# Patient Record
Sex: Female | Born: 1940
Health system: Southern US, Community
[De-identification: ages and names within clinical notes are randomized; demographics above are authoritative.]

## PROBLEM LIST (undated history)

## (undated) DIAGNOSIS — E1122 Type 2 diabetes mellitus with diabetic chronic kidney disease: Secondary | ICD-10-CM

## (undated) DIAGNOSIS — E785 Hyperlipidemia, unspecified: Secondary | ICD-10-CM

## (undated) DIAGNOSIS — Z794 Long term (current) use of insulin: Secondary | ICD-10-CM

## (undated) DIAGNOSIS — Z992 Dependence on renal dialysis: Secondary | ICD-10-CM

## (undated) DIAGNOSIS — N183 Chronic kidney disease, stage 3 unspecified: Secondary | ICD-10-CM

## (undated) DIAGNOSIS — I10 Essential (primary) hypertension: Secondary | ICD-10-CM

## (undated) DIAGNOSIS — N186 End stage renal disease: Secondary | ICD-10-CM

## (undated) DIAGNOSIS — I2510 Atherosclerotic heart disease of native coronary artery without angina pectoris: Secondary | ICD-10-CM

## (undated) HISTORY — PX: ABDOMINAL HYSTERECTOMY: SHX81

---

## 2016-08-14 HISTORY — PX: CORONARY ANGIOPLASTY WITH STENT PLACEMENT: SHX49

## 2016-08-14 HISTORY — PX: CARDIAC CATHETERIZATION: SHX172

## 2016-09-06 DIAGNOSIS — Z66 Do not resuscitate: Secondary | ICD-10-CM

## 2016-09-06 DIAGNOSIS — D649 Anemia, unspecified: Secondary | ICD-10-CM

## 2016-09-06 DIAGNOSIS — Z7982 Long term (current) use of aspirin: Secondary | ICD-10-CM

## 2016-09-06 DIAGNOSIS — I119 Hypertensive heart disease without heart failure: Secondary | ICD-10-CM

## 2016-09-06 DIAGNOSIS — E785 Hyperlipidemia, unspecified: Secondary | ICD-10-CM | POA: Diagnosis not present

## 2016-09-06 DIAGNOSIS — K219 Gastro-esophageal reflux disease without esophagitis: Secondary | ICD-10-CM | POA: Diagnosis not present

## 2016-09-06 DIAGNOSIS — F0390 Unspecified dementia without behavioral disturbance: Secondary | ICD-10-CM

## 2016-09-06 DIAGNOSIS — I25110 Atherosclerotic heart disease of native coronary artery with unstable angina pectoris: Secondary | ICD-10-CM

## 2016-09-06 DIAGNOSIS — I160 Hypertensive urgency: Secondary | ICD-10-CM

## 2016-09-06 DIAGNOSIS — J811 Chronic pulmonary edema: Secondary | ICD-10-CM

## 2016-09-06 DIAGNOSIS — R079 Chest pain, unspecified: Secondary | ICD-10-CM | POA: Diagnosis not present

## 2016-09-07 DIAGNOSIS — I25110 Atherosclerotic heart disease of native coronary artery with unstable angina pectoris: Secondary | ICD-10-CM | POA: Diagnosis not present

## 2016-09-07 DIAGNOSIS — J811 Chronic pulmonary edema: Secondary | ICD-10-CM | POA: Diagnosis not present

## 2016-09-07 DIAGNOSIS — I160 Hypertensive urgency: Secondary | ICD-10-CM | POA: Diagnosis not present

## 2016-09-07 DIAGNOSIS — R079 Chest pain, unspecified: Secondary | ICD-10-CM | POA: Diagnosis not present

## 2016-09-08 DIAGNOSIS — J811 Chronic pulmonary edema: Secondary | ICD-10-CM | POA: Diagnosis not present

## 2016-09-08 DIAGNOSIS — I25110 Atherosclerotic heart disease of native coronary artery with unstable angina pectoris: Secondary | ICD-10-CM | POA: Diagnosis not present

## 2016-09-08 DIAGNOSIS — I160 Hypertensive urgency: Secondary | ICD-10-CM | POA: Diagnosis not present

## 2016-09-08 DIAGNOSIS — R079 Chest pain, unspecified: Secondary | ICD-10-CM | POA: Diagnosis not present

## 2016-09-09 DIAGNOSIS — I160 Hypertensive urgency: Secondary | ICD-10-CM | POA: Diagnosis not present

## 2016-09-09 DIAGNOSIS — J811 Chronic pulmonary edema: Secondary | ICD-10-CM | POA: Diagnosis not present

## 2016-09-09 DIAGNOSIS — I25110 Atherosclerotic heart disease of native coronary artery with unstable angina pectoris: Secondary | ICD-10-CM | POA: Diagnosis not present

## 2016-09-09 DIAGNOSIS — R079 Chest pain, unspecified: Secondary | ICD-10-CM | POA: Diagnosis not present

## 2016-09-10 DIAGNOSIS — R079 Chest pain, unspecified: Secondary | ICD-10-CM | POA: Diagnosis not present

## 2016-09-10 DIAGNOSIS — J811 Chronic pulmonary edema: Secondary | ICD-10-CM | POA: Diagnosis not present

## 2016-09-10 DIAGNOSIS — I160 Hypertensive urgency: Secondary | ICD-10-CM | POA: Diagnosis not present

## 2016-09-10 DIAGNOSIS — I25110 Atherosclerotic heart disease of native coronary artery with unstable angina pectoris: Secondary | ICD-10-CM | POA: Diagnosis not present

## 2016-09-11 DIAGNOSIS — I25110 Atherosclerotic heart disease of native coronary artery with unstable angina pectoris: Secondary | ICD-10-CM | POA: Diagnosis not present

## 2016-09-11 DIAGNOSIS — J811 Chronic pulmonary edema: Secondary | ICD-10-CM | POA: Diagnosis not present

## 2016-09-11 DIAGNOSIS — R079 Chest pain, unspecified: Secondary | ICD-10-CM | POA: Diagnosis not present

## 2016-09-11 DIAGNOSIS — I160 Hypertensive urgency: Secondary | ICD-10-CM | POA: Diagnosis not present

## 2017-02-14 HISTORY — PX: CARDIAC CATHETERIZATION: SHX172

## 2017-02-18 DIAGNOSIS — K219 Gastro-esophageal reflux disease without esophagitis: Secondary | ICD-10-CM | POA: Diagnosis not present

## 2017-02-18 DIAGNOSIS — J189 Pneumonia, unspecified organism: Secondary | ICD-10-CM

## 2017-02-18 DIAGNOSIS — J9690 Respiratory failure, unspecified, unspecified whether with hypoxia or hypercapnia: Secondary | ICD-10-CM | POA: Diagnosis not present

## 2017-02-18 DIAGNOSIS — E119 Type 2 diabetes mellitus without complications: Secondary | ICD-10-CM | POA: Diagnosis not present

## 2017-02-18 DIAGNOSIS — I161 Hypertensive emergency: Secondary | ICD-10-CM

## 2017-02-18 DIAGNOSIS — R079 Chest pain, unspecified: Secondary | ICD-10-CM | POA: Diagnosis not present

## 2017-02-18 DIAGNOSIS — N189 Chronic kidney disease, unspecified: Secondary | ICD-10-CM

## 2017-02-18 DIAGNOSIS — E785 Hyperlipidemia, unspecified: Secondary | ICD-10-CM | POA: Diagnosis not present

## 2017-02-19 DIAGNOSIS — I249 Acute ischemic heart disease, unspecified: Secondary | ICD-10-CM | POA: Diagnosis not present

## 2017-02-19 DIAGNOSIS — R079 Chest pain, unspecified: Secondary | ICD-10-CM | POA: Diagnosis not present

## 2018-05-02 DIAGNOSIS — I119 Hypertensive heart disease without heart failure: Secondary | ICD-10-CM | POA: Diagnosis not present

## 2018-05-02 DIAGNOSIS — I249 Acute ischemic heart disease, unspecified: Secondary | ICD-10-CM | POA: Diagnosis not present

## 2018-05-02 DIAGNOSIS — Z7984 Long term (current) use of oral hypoglycemic drugs: Secondary | ICD-10-CM | POA: Diagnosis not present

## 2018-05-02 DIAGNOSIS — J181 Lobar pneumonia, unspecified organism: Secondary | ICD-10-CM | POA: Diagnosis not present

## 2018-05-02 DIAGNOSIS — Z7982 Long term (current) use of aspirin: Secondary | ICD-10-CM | POA: Diagnosis not present

## 2018-05-02 DIAGNOSIS — A419 Sepsis, unspecified organism: Secondary | ICD-10-CM | POA: Diagnosis not present

## 2018-05-02 DIAGNOSIS — I340 Nonrheumatic mitral (valve) insufficiency: Secondary | ICD-10-CM | POA: Diagnosis not present

## 2018-05-02 DIAGNOSIS — I509 Heart failure, unspecified: Secondary | ICD-10-CM | POA: Diagnosis present

## 2018-05-02 DIAGNOSIS — N289 Disorder of kidney and ureter, unspecified: Secondary | ICD-10-CM | POA: Diagnosis not present

## 2018-05-02 DIAGNOSIS — E785 Hyperlipidemia, unspecified: Secondary | ICD-10-CM | POA: Diagnosis not present

## 2018-05-02 DIAGNOSIS — I361 Nonrheumatic tricuspid (valve) insufficiency: Secondary | ICD-10-CM | POA: Diagnosis not present

## 2018-05-02 DIAGNOSIS — Z2821 Immunization not carried out because of patient refusal: Secondary | ICD-10-CM | POA: Diagnosis not present

## 2018-05-02 DIAGNOSIS — J690 Pneumonitis due to inhalation of food and vomit: Secondary | ICD-10-CM | POA: Diagnosis present

## 2018-05-02 DIAGNOSIS — E1165 Type 2 diabetes mellitus with hyperglycemia: Secondary | ICD-10-CM | POA: Diagnosis present

## 2018-05-02 DIAGNOSIS — I214 Non-ST elevation (NSTEMI) myocardial infarction: Secondary | ICD-10-CM | POA: Diagnosis not present

## 2018-05-02 DIAGNOSIS — N179 Acute kidney failure, unspecified: Secondary | ICD-10-CM | POA: Diagnosis present

## 2018-05-02 DIAGNOSIS — J9601 Acute respiratory failure with hypoxia: Secondary | ICD-10-CM | POA: Diagnosis present

## 2018-05-02 DIAGNOSIS — Z794 Long term (current) use of insulin: Secondary | ICD-10-CM | POA: Diagnosis not present

## 2018-05-02 DIAGNOSIS — E119 Type 2 diabetes mellitus without complications: Secondary | ICD-10-CM | POA: Diagnosis not present

## 2018-05-02 DIAGNOSIS — R0603 Acute respiratory distress: Secondary | ICD-10-CM | POA: Diagnosis not present

## 2018-05-02 DIAGNOSIS — J189 Pneumonia, unspecified organism: Secondary | ICD-10-CM | POA: Diagnosis not present

## 2018-05-02 DIAGNOSIS — N183 Chronic kidney disease, stage 3 (moderate): Secondary | ICD-10-CM | POA: Diagnosis present

## 2018-05-02 DIAGNOSIS — M1990 Unspecified osteoarthritis, unspecified site: Secondary | ICD-10-CM | POA: Diagnosis present

## 2018-05-02 DIAGNOSIS — J9691 Respiratory failure, unspecified with hypoxia: Secondary | ICD-10-CM | POA: Diagnosis not present

## 2018-05-02 DIAGNOSIS — Z87891 Personal history of nicotine dependence: Secondary | ICD-10-CM | POA: Diagnosis not present

## 2018-05-02 DIAGNOSIS — I130 Hypertensive heart and chronic kidney disease with heart failure and stage 1 through stage 4 chronic kidney disease, or unspecified chronic kidney disease: Secondary | ICD-10-CM | POA: Diagnosis not present

## 2018-05-02 DIAGNOSIS — Z8673 Personal history of transient ischemic attack (TIA), and cerebral infarction without residual deficits: Secondary | ICD-10-CM | POA: Diagnosis not present

## 2018-05-02 DIAGNOSIS — Z79899 Other long term (current) drug therapy: Secondary | ICD-10-CM | POA: Diagnosis not present

## 2018-05-02 DIAGNOSIS — I429 Cardiomyopathy, unspecified: Secondary | ICD-10-CM | POA: Diagnosis not present

## 2018-05-02 DIAGNOSIS — I2510 Atherosclerotic heart disease of native coronary artery without angina pectoris: Secondary | ICD-10-CM | POA: Diagnosis present

## 2018-05-02 DIAGNOSIS — E1110 Type 2 diabetes mellitus with ketoacidosis without coma: Secondary | ICD-10-CM | POA: Diagnosis not present

## 2018-05-02 DIAGNOSIS — J811 Chronic pulmonary edema: Secondary | ICD-10-CM | POA: Diagnosis not present

## 2018-05-03 DIAGNOSIS — I361 Nonrheumatic tricuspid (valve) insufficiency: Secondary | ICD-10-CM | POA: Diagnosis not present

## 2018-05-03 DIAGNOSIS — I340 Nonrheumatic mitral (valve) insufficiency: Secondary | ICD-10-CM | POA: Diagnosis not present

## 2018-05-03 DIAGNOSIS — N289 Disorder of kidney and ureter, unspecified: Secondary | ICD-10-CM | POA: Diagnosis not present

## 2018-05-03 DIAGNOSIS — I214 Non-ST elevation (NSTEMI) myocardial infarction: Secondary | ICD-10-CM

## 2018-05-03 DIAGNOSIS — E1110 Type 2 diabetes mellitus with ketoacidosis without coma: Secondary | ICD-10-CM | POA: Diagnosis not present

## 2018-05-03 DIAGNOSIS — J9601 Acute respiratory failure with hypoxia: Secondary | ICD-10-CM

## 2018-05-03 DIAGNOSIS — J9691 Respiratory failure, unspecified with hypoxia: Secondary | ICD-10-CM | POA: Diagnosis not present

## 2018-05-04 DIAGNOSIS — E1110 Type 2 diabetes mellitus with ketoacidosis without coma: Secondary | ICD-10-CM | POA: Diagnosis not present

## 2018-05-04 DIAGNOSIS — J9691 Respiratory failure, unspecified with hypoxia: Secondary | ICD-10-CM | POA: Diagnosis not present

## 2018-05-04 DIAGNOSIS — I214 Non-ST elevation (NSTEMI) myocardial infarction: Secondary | ICD-10-CM | POA: Diagnosis not present

## 2018-05-05 ENCOUNTER — Inpatient Hospital Stay: Payer: Medicare Other

## 2018-05-05 ENCOUNTER — Encounter (HOSPITAL_COMMUNITY): Payer: Self-pay | Admitting: Cardiology

## 2018-05-05 ENCOUNTER — Inpatient Hospital Stay (HOSPITAL_COMMUNITY)
Admission: AD | Admit: 2018-05-05 | Discharge: 2018-05-09 | DRG: 246 | Disposition: A | Discharge status: Skilled Nursing Facility | Payer: Medicare Other | Source: Other Acute Inpatient Hospital | Attending: Internal Medicine | Admitting: Internal Medicine

## 2018-05-05 DIAGNOSIS — I429 Cardiomyopathy, unspecified: Secondary | ICD-10-CM | POA: Diagnosis present

## 2018-05-05 DIAGNOSIS — Z79899 Other long term (current) drug therapy: Secondary | ICD-10-CM

## 2018-05-05 DIAGNOSIS — Z955 Presence of coronary angioplasty implant and graft: Secondary | ICD-10-CM | POA: Diagnosis not present

## 2018-05-05 DIAGNOSIS — I480 Paroxysmal atrial fibrillation: Secondary | ICD-10-CM | POA: Diagnosis not present

## 2018-05-05 DIAGNOSIS — M6281 Muscle weakness (generalized): Secondary | ICD-10-CM | POA: Diagnosis not present

## 2018-05-05 DIAGNOSIS — Z800 Family history of malignant neoplasm of digestive organs: Secondary | ICD-10-CM | POA: Diagnosis not present

## 2018-05-05 DIAGNOSIS — E1122 Type 2 diabetes mellitus with diabetic chronic kidney disease: Secondary | ICD-10-CM | POA: Diagnosis not present

## 2018-05-05 DIAGNOSIS — Z7982 Long term (current) use of aspirin: Secondary | ICD-10-CM

## 2018-05-05 DIAGNOSIS — I2510 Atherosclerotic heart disease of native coronary artery without angina pectoris: Secondary | ICD-10-CM | POA: Diagnosis present

## 2018-05-05 DIAGNOSIS — E1110 Type 2 diabetes mellitus with ketoacidosis without coma: Secondary | ICD-10-CM | POA: Diagnosis present

## 2018-05-05 DIAGNOSIS — R9389 Abnormal findings on diagnostic imaging of other specified body structures: Secondary | ICD-10-CM | POA: Diagnosis not present

## 2018-05-05 DIAGNOSIS — Z741 Need for assistance with personal care: Secondary | ICD-10-CM | POA: Diagnosis not present

## 2018-05-05 DIAGNOSIS — N289 Disorder of kidney and ureter, unspecified: Secondary | ICD-10-CM | POA: Diagnosis not present

## 2018-05-05 DIAGNOSIS — Z825 Family history of asthma and other chronic lower respiratory diseases: Secondary | ICD-10-CM | POA: Diagnosis not present

## 2018-05-05 DIAGNOSIS — J9601 Acute respiratory failure with hypoxia: Secondary | ICD-10-CM | POA: Diagnosis not present

## 2018-05-05 DIAGNOSIS — D631 Anemia in chronic kidney disease: Secondary | ICD-10-CM | POA: Diagnosis present

## 2018-05-05 DIAGNOSIS — I252 Old myocardial infarction: Secondary | ICD-10-CM | POA: Diagnosis not present

## 2018-05-05 DIAGNOSIS — I132 Hypertensive heart and chronic kidney disease with heart failure and with stage 5 chronic kidney disease, or end stage renal disease: Secondary | ICD-10-CM | POA: Diagnosis not present

## 2018-05-05 DIAGNOSIS — K219 Gastro-esophageal reflux disease without esophagitis: Secondary | ICD-10-CM | POA: Diagnosis not present

## 2018-05-05 DIAGNOSIS — Z79891 Long term (current) use of opiate analgesic: Secondary | ICD-10-CM | POA: Diagnosis not present

## 2018-05-05 DIAGNOSIS — Z794 Long term (current) use of insulin: Secondary | ICD-10-CM

## 2018-05-05 DIAGNOSIS — I340 Nonrheumatic mitral (valve) insufficiency: Secondary | ICD-10-CM | POA: Diagnosis present

## 2018-05-05 DIAGNOSIS — R279 Unspecified lack of coordination: Secondary | ICD-10-CM | POA: Diagnosis not present

## 2018-05-05 DIAGNOSIS — I5043 Acute on chronic combined systolic (congestive) and diastolic (congestive) heart failure: Secondary | ICD-10-CM | POA: Diagnosis not present

## 2018-05-05 DIAGNOSIS — Z743 Need for continuous supervision: Secondary | ICD-10-CM | POA: Diagnosis not present

## 2018-05-05 DIAGNOSIS — E785 Hyperlipidemia, unspecified: Secondary | ICD-10-CM | POA: Diagnosis present

## 2018-05-05 DIAGNOSIS — Z8249 Family history of ischemic heart disease and other diseases of the circulatory system: Secondary | ICD-10-CM | POA: Diagnosis not present

## 2018-05-05 DIAGNOSIS — Z992 Dependence on renal dialysis: Secondary | ICD-10-CM

## 2018-05-05 DIAGNOSIS — R0603 Acute respiratory distress: Secondary | ICD-10-CM | POA: Diagnosis not present

## 2018-05-05 DIAGNOSIS — N179 Acute kidney failure, unspecified: Secondary | ICD-10-CM | POA: Diagnosis not present

## 2018-05-05 DIAGNOSIS — I5023 Acute on chronic systolic (congestive) heart failure: Secondary | ICD-10-CM | POA: Diagnosis present

## 2018-05-05 DIAGNOSIS — N183 Chronic kidney disease, stage 3 unspecified: Secondary | ICD-10-CM | POA: Diagnosis present

## 2018-05-05 DIAGNOSIS — Z6834 Body mass index (BMI) 34.0-34.9, adult: Secondary | ICD-10-CM

## 2018-05-05 DIAGNOSIS — R269 Unspecified abnormalities of gait and mobility: Secondary | ICD-10-CM | POA: Diagnosis not present

## 2018-05-05 DIAGNOSIS — I25119 Atherosclerotic heart disease of native coronary artery with unspecified angina pectoris: Secondary | ICD-10-CM | POA: Diagnosis not present

## 2018-05-05 DIAGNOSIS — R633 Feeding difficulties: Secondary | ICD-10-CM | POA: Diagnosis not present

## 2018-05-05 DIAGNOSIS — I214 Non-ST elevation (NSTEMI) myocardial infarction: Secondary | ICD-10-CM | POA: Diagnosis not present

## 2018-05-05 DIAGNOSIS — R278 Other lack of coordination: Secondary | ICD-10-CM | POA: Diagnosis not present

## 2018-05-05 DIAGNOSIS — R0602 Shortness of breath: Secondary | ICD-10-CM | POA: Diagnosis not present

## 2018-05-05 DIAGNOSIS — N186 End stage renal disease: Secondary | ICD-10-CM | POA: Diagnosis not present

## 2018-05-05 DIAGNOSIS — J189 Pneumonia, unspecified organism: Secondary | ICD-10-CM | POA: Diagnosis present

## 2018-05-05 DIAGNOSIS — R262 Difficulty in walking, not elsewhere classified: Secondary | ICD-10-CM | POA: Diagnosis not present

## 2018-05-05 DIAGNOSIS — I5021 Acute systolic (congestive) heart failure: Secondary | ICD-10-CM | POA: Diagnosis not present

## 2018-05-05 DIAGNOSIS — Z7722 Contact with and (suspected) exposure to environmental tobacco smoke (acute) (chronic): Secondary | ICD-10-CM | POA: Diagnosis present

## 2018-05-05 DIAGNOSIS — I071 Rheumatic tricuspid insufficiency: Secondary | ICD-10-CM | POA: Diagnosis present

## 2018-05-05 DIAGNOSIS — Z8673 Personal history of transient ischemic attack (TIA), and cerebral infarction without residual deficits: Secondary | ICD-10-CM

## 2018-05-05 DIAGNOSIS — I10 Essential (primary) hypertension: Secondary | ICD-10-CM | POA: Diagnosis present

## 2018-05-05 DIAGNOSIS — J9691 Respiratory failure, unspecified with hypoxia: Secondary | ICD-10-CM | POA: Diagnosis not present

## 2018-05-05 HISTORY — DX: Type 2 diabetes mellitus with diabetic chronic kidney disease: E11.22

## 2018-05-05 HISTORY — DX: Chronic kidney disease, stage 3 unspecified: N18.30

## 2018-05-05 HISTORY — DX: End stage renal disease: N18.6

## 2018-05-05 HISTORY — DX: Dependence on renal dialysis: Z99.2

## 2018-05-05 HISTORY — DX: Essential (primary) hypertension: I10

## 2018-05-05 HISTORY — DX: Atherosclerotic heart disease of native coronary artery without angina pectoris: I25.10

## 2018-05-05 HISTORY — DX: Type 2 diabetes mellitus with diabetic chronic kidney disease: Z79.4

## 2018-05-05 HISTORY — DX: Hyperlipidemia, unspecified: E78.5

## 2018-05-05 HISTORY — DX: Chronic kidney disease, stage 3 (moderate): N18.3

## 2018-05-05 LAB — CBC WITH DIFFERENTIAL/PLATELET
Abs Immature Granulocytes: 0.06 K/uL (ref 0.00–0.07)
Basophils Absolute: 0 K/uL (ref 0.0–0.1)
Basophils Relative: 0 %
Eosinophils Absolute: 0 K/uL (ref 0.0–0.5)
Eosinophils Relative: 0 %
HCT: 32.9 % — ABNORMAL LOW (ref 36.0–46.0)
Hemoglobin: 10.1 g/dL — ABNORMAL LOW (ref 12.0–15.0)
Immature Granulocytes: 1 %
Lymphocytes Relative: 13 %
Lymphs Abs: 1.1 K/uL (ref 0.7–4.0)
MCH: 28.9 pg (ref 26.0–34.0)
MCHC: 30.7 g/dL (ref 30.0–36.0)
MCV: 94.3 fL (ref 80.0–100.0)
Monocytes Absolute: 0.6 K/uL (ref 0.1–1.0)
Monocytes Relative: 8 %
Neutro Abs: 6.3 K/uL (ref 1.7–7.7)
Neutrophils Relative %: 78 %
Platelets: 212 K/uL (ref 150–400)
RBC: 3.49 MIL/uL — ABNORMAL LOW (ref 3.87–5.11)
RDW: 15.5 % (ref 11.5–15.5)
WBC: 8.1 K/uL (ref 4.0–10.5)
nRBC: 0 % (ref 0.0–0.2)

## 2018-05-05 LAB — COMPREHENSIVE METABOLIC PANEL
ALT: 91 U/L — ABNORMAL HIGH (ref 0–44)
AST: 67 U/L — ABNORMAL HIGH (ref 15–41)
Albumin: 2.5 g/dL — ABNORMAL LOW (ref 3.5–5.0)
Alkaline Phosphatase: 72 U/L (ref 38–126)
Anion gap: 13 (ref 5–15)
BUN: 46 mg/dL — AB (ref 8–23)
CO2: 15 mmol/L — ABNORMAL LOW (ref 22–32)
Calcium: 8.2 mg/dL — ABNORMAL LOW (ref 8.9–10.3)
Chloride: 113 mmol/L — ABNORMAL HIGH (ref 98–111)
Creatinine, Ser: 1.91 mg/dL — ABNORMAL HIGH (ref 0.44–1.00)
GFR calc Af Amer: 29 mL/min — ABNORMAL LOW (ref >60)
GFR calc non Af Amer: 25 mL/min — ABNORMAL LOW (ref >60)
Glucose, Bld: 276 mg/dL — ABNORMAL HIGH (ref 70–99)
Potassium: 4.4 mmol/L (ref 3.5–5.1)
Sodium: 141 mmol/L (ref 135–145)
Total Bilirubin: 0.7 mg/dL (ref 0.3–1.2)
Total Protein: 5.2 g/dL — ABNORMAL LOW (ref 6.5–8.1)

## 2018-05-05 LAB — HEPARIN LEVEL (UNFRACTIONATED): Heparin Unfractionated: 0.12 IU/mL — ABNORMAL LOW (ref 0.30–0.70)

## 2018-05-05 LAB — GLUCOSE, CAPILLARY
Glucose-Capillary: 178 mg/dL — ABNORMAL HIGH (ref 70–99)
Glucose-Capillary: 203 mg/dL — ABNORMAL HIGH (ref 70–99)
Glucose-Capillary: 228 mg/dL — ABNORMAL HIGH (ref 70–99)

## 2018-05-05 LAB — TROPONIN I: Troponin I: 12.77 ng/mL (ref <0.03)

## 2018-05-05 MED ORDER — GLIMEPIRIDE 4 MG PO TABS
2.0000 mg | ORAL_TABLET | Freq: Every day | ORAL | Status: DC
  Administered 2018-05-06: 2 mg via ORAL
  Filled 2018-05-05: qty 2

## 2018-05-05 MED ORDER — PRAVASTATIN SODIUM 40 MG PO TABS
40.0000 mg | ORAL_TABLET | Freq: Every day | ORAL | Status: DC
  Administered 2018-05-05 – 2018-05-07 (×3): 40 mg via ORAL
  Filled 2018-05-05 (×3): qty 1

## 2018-05-05 MED ORDER — INSULIN GLARGINE 100 UNIT/ML ~~LOC~~ SOLN: 3.0000 [IU] | Freq: Every day | SUBCUTANEOUS | Status: DC

## 2018-05-05 MED ORDER — HEPARIN (PORCINE) 25000 UT/250ML-% IV SOLN
1100.0000 [IU]/h | INTRAVENOUS | Status: DC
  Administered 2018-05-05: 900 [IU]/h via INTRAVENOUS
  Filled 2018-05-05: qty 250

## 2018-05-05 MED ORDER — AMLODIPINE BESYLATE 10 MG PO TABS
10.0000 mg | ORAL_TABLET | Freq: Every day | ORAL | Status: DC
  Administered 2018-05-06 – 2018-05-07 (×2): 10 mg via ORAL
  Filled 2018-05-05 (×2): qty 1

## 2018-05-05 MED ORDER — PIOGLITAZONE HCL 15 MG PO TABS
15.0000 mg | ORAL_TABLET | Freq: Every day | ORAL | Status: DC
  Administered 2018-05-06: 15 mg via ORAL
  Filled 2018-05-05: qty 1

## 2018-05-05 MED ORDER — SODIUM CHLORIDE 0.9 % IV SOLN
INTRAVENOUS | Status: DC
  Administered 2018-05-05: 21:00:00 via INTRAVENOUS

## 2018-05-05 MED ORDER — LISINOPRIL 40 MG PO TABS
40.0000 mg | ORAL_TABLET | Freq: Every day | ORAL | Status: DC
  Administered 2018-05-06: 40 mg via ORAL
  Filled 2018-05-05: qty 1

## 2018-05-05 MED ORDER — SODIUM CHLORIDE 0.9% FLUSH
10.0000 mL | Freq: Two times a day (BID) | INTRAVENOUS | Status: DC
  Administered 2018-05-05 – 2018-05-09 (×5): 10 mL

## 2018-05-05 MED ORDER — FUROSEMIDE 20 MG PO TABS
20.0000 mg | ORAL_TABLET | Freq: Every day | ORAL | Status: DC
  Administered 2018-05-05 – 2018-05-07 (×3): 20 mg via ORAL
  Filled 2018-05-05 (×3): qty 1

## 2018-05-05 MED ORDER — SODIUM CHLORIDE 0.9% FLUSH: 3.0000 mL | INTRAVENOUS | Status: DC | PRN

## 2018-05-05 MED ORDER — ONDANSETRON HCL 4 MG PO TABS: 4.0000 mg | ORAL_TABLET | Freq: Four times a day (QID) | ORAL | Status: DC | PRN

## 2018-05-05 MED ORDER — ONDANSETRON HCL 4 MG/2ML IJ SOLN
4.0000 mg | Freq: Four times a day (QID) | INTRAMUSCULAR | Status: DC | PRN
  Administered 2018-05-07 – 2018-05-08 (×2): 4 mg via INTRAVENOUS
  Filled 2018-05-05 (×2): qty 2

## 2018-05-05 MED ORDER — ACETAMINOPHEN 325 MG PO TABS: 650.0000 mg | ORAL_TABLET | Freq: Four times a day (QID) | ORAL | Status: DC | PRN

## 2018-05-05 MED ORDER — SODIUM CHLORIDE 0.9% FLUSH
10.0000 mL | INTRAVENOUS | Status: DC | PRN
  Administered 2018-05-07: 05:00:00 10 mL
  Administered 2018-05-08: 30 mL
  Administered 2018-05-09: 10 mL
  Filled 2018-05-05 (×3): qty 40

## 2018-05-05 MED ORDER — INSULIN ASPART 100 UNIT/ML ~~LOC~~ SOLN
0.0000 [IU] | Freq: Three times a day (TID) | SUBCUTANEOUS | Status: DC
  Administered 2018-05-06: 5 [IU] via SUBCUTANEOUS
  Administered 2018-05-06: 2 [IU] via SUBCUTANEOUS
  Administered 2018-05-06: 3 [IU] via SUBCUTANEOUS
  Administered 2018-05-07: 07:00:00 2 [IU] via SUBCUTANEOUS
  Administered 2018-05-07 – 2018-05-08 (×5): 3 [IU] via SUBCUTANEOUS
  Administered 2018-05-09: 12:00:00 2 [IU] via SUBCUTANEOUS
  Administered 2018-05-09: 17:00:00 3 [IU] via SUBCUTANEOUS

## 2018-05-05 MED ORDER — NITROGLYCERIN 0.4 MG SL SUBL: 0.4000 mg | SUBLINGUAL_TABLET | SUBLINGUAL | Status: DC | PRN

## 2018-05-05 MED ORDER — SODIUM CHLORIDE 0.9% FLUSH
3.0000 mL | Freq: Two times a day (BID) | INTRAVENOUS | Status: DC
  Administered 2018-05-05: 3 mL via INTRAVENOUS

## 2018-05-05 MED ORDER — ACETAMINOPHEN 650 MG RE SUPP: 650.0000 mg | Freq: Four times a day (QID) | RECTAL | Status: DC | PRN

## 2018-05-05 MED ORDER — TRAMADOL HCL 50 MG PO TABS: 50.0000 mg | ORAL_TABLET | Freq: Four times a day (QID) | ORAL | Status: DC | PRN

## 2018-05-05 MED ORDER — HEPARIN BOLUS VIA INFUSION
2000.0000 [IU] | Freq: Once | INTRAVENOUS | Status: AC
  Administered 2018-05-05: 2000 [IU] via INTRAVENOUS
  Filled 2018-05-05: qty 2000

## 2018-05-05 MED ORDER — SODIUM CHLORIDE 0.9 % IV SOLN: 250.0000 mL | INTRAVENOUS | Status: DC | PRN

## 2018-05-05 MED ORDER — METFORMIN HCL 500 MG PO TABS
500.0000 mg | ORAL_TABLET | Freq: Two times a day (BID) | ORAL | Status: DC
  Administered 2018-05-05: 500 mg via ORAL
  Filled 2018-05-05: qty 1

## 2018-05-05 MED ORDER — ASPIRIN 81 MG PO CHEW
81.0000 mg | CHEWABLE_TABLET | ORAL | Status: AC
  Administered 2018-05-06: 81 mg via ORAL
  Filled 2018-05-05: qty 1

## 2018-05-05 MED ORDER — METOPROLOL TARTRATE 25 MG PO TABS
50.0000 mg | ORAL_TABLET | Freq: Two times a day (BID) | ORAL | Status: DC
  Administered 2018-05-05 – 2018-05-07 (×5): 50 mg via ORAL
  Filled 2018-05-05 (×3): qty 2
  Filled 2018-05-05 (×2): qty 1

## 2018-05-05 NOTE — Progress Notes (Addendum)
[place] for heparin  Indication: chest pain/ACS  Allergies  Allergen Reactions  . Codeine Other (See Comments)  . Penicillins Other (See Comments)    Patient Measurements: Heparin dosing weight: ~ 72kg Wt= 88kg (1/18 at [facility]) Ht: 5' 3''  Vital Signs: Temp: 98.8 F (37.1 C) (01/20 2035) Temp Source: Oral (01/20 2035) BP: 118/77 (01/20 2035) Pulse Rate: 103 (01/20 2037)  Labs: Recent Labs    05/05/18 2018 05/05/18 2234  HGB 10.1*  --   HCT 32.9*  --   PLT 212  --   HEPARINUNFRC  --  0.12*  CREATININE 1.91*  --   TROPONINI 12.77*  --     CrCl cannot be calculated (Unknown ideal weight.).  Assessment: 78 yo female with NSTEMI for heparin  Goal of Therapy:  Heparin level 0.3-0.7 units/ml Monitor platelets by anticoagulation protocol: Yes   Plan:  Heparin 2000 units IV bolus, then increase heparin 1100 units/hr Follow-up am labs.   Phillis Knack, PharmD, BCPS

## 2018-05-05 NOTE — Consult Note (Addendum)
Cardiology Consultation:   Patient ID: Ashley Fuller MRN: [id number]; DOB: December 14, 1940  Admit date: 05/05/2018 Date of Consult: 05/05/2018  Primary Care Provider: Raina Mina., MD Primary Cardiologist: No primary care provider on file.  Ashley Fuller, [facility] in [place] Primary Electrophysiologist:  None    Patient Profile:   Ashley Fuller is a 78 y.o. female with a hx of CAD s/p NSTEMI with DES to LAD and D1 08/2016, PAF in setting of pneumonia, hyperlipidemia, CVA, type 2 diabetes, CKD stage III who is being seen today for the evaluation of NSTEMI at the request of Ashley Fuller.  History of Present Illness:   Ashley Fuller has a history of non-ST elevation MI in May 2018 and under went stent placement to the proximal LAD and first diagonal.  In November 2018 she was feeling somewhat ill per record review and was transferred to [facility] where she underwent a repeat cardiac catheterization that demonstrated patent stent sites with some mid LAD disease which was managed medically.  She apparently had pneumonia at that time and was treated for that.  There was concerned by home physical therapy that she was getting bradycardic so a Holter monitor was checked and demonstrated no significant bradycardia.  She is noted to be very sedentary, sitting watching TV most of the time.  The presented to the emergency department at [facility] due to complaints of worsening cough and chest soreness due to cough that had been ongoing for almost 2 weeks and with associated shortness of breath.  Cough was productive of white sputum and worsened at night.  Patient presented with a worse coughing fit at dinner with associated chest soreness.  It was noted that at baseline the patient ambulates with a walker and lives at home with her son and family.  Initially her blood pressure was elevated at 180/115 and she was tachycardic and tachypneic.  She was also hypoxic per ABG.  She had  leukocytosis and elevated BUN/creatinine 38/1.6 with baseline creatinine 1.1-1.2.  She was hyperglycemic with anion gap of 21 and elevated lactic acid.  EKG showed normal sinus rhythm at 97 bpm with T wave inversions in leads I, aVL and V1.  She was started on breathing treatments, IV hydration and IV antibiotics.  Flu swab was negative.  She was also started on an IV insulin drip and placed on BiPAP. Chest x-ray on 05/04/2018 showed patchy perihilar lung opacities, slightly improved, which may represent improving pulmonary edema, although a component of pneumonia is not excluded.  Stable small right pleural effusion.  Stable mild cardiomegaly.  She was admitted for treatment of acute hypoxic respiratory failure and DKA.  She was found to have an elevated troponinI 3.11> 18.10  > 42.60> 35.30> 20.60> 25.60. Echocardiogram was technically difficult, showed normal LV wall thickness, moderately-severely impaired LV function with EF 30-35%, left atrium severely dilated, moderate mitral regurgitation and mild tricuspid regurgitation.  She was seen in Cardiology consultation by Dr. Geraldo Fuller and the patient was transferred from [facility] to [facility] for further management and treatment of NSTEMI.   Upon my examination the patient denies any chest pain or discomfort at any point during this illness.  She does have chronic shortness of breath, worsened at presentation, seems to be somewhat better now.  The patient is not very conversant but denies any overall discomfort.  Her daughter is very knowledgeable and does most of the talking.  The daughter reports that the patient has been  on Brilinta and aspirin however at this next refill of Brilinta the cost was to be greater than $500 so she called Ashley Fuller who told her that the patient could stop Brilinta and switch to aspirin 325 mg daily.  Just prior to admission she was still taking the Brilinta as she had not yet run out.  The patient has never  smoked although her husband smoked and she was exposed to secondhand smoke.  She does not drink alcohol.  She uses a walker at home and is mostly sedentary.  Dr. Geraldo Fuller put the patient on Plavix while at [facility].  This would be a more affordable choice for the patient.  Past Medical History:  Diagnosis Date  . CAD (coronary artery disease)    a. NSTEMI with DES to prox LAD & 1st diag 08/2016  . CKD stage 3 due to type 2 diabetes mellitus ([place])   . Controlled type 2 diabetes mellitus with chronic kidney disease on chronic dialysis, with long-term current use of insulin ([place])   . Hyperlipidemia LDL goal <70   . Hypertension     Past Surgical History:  Procedure Laterality Date  . CARDIAC CATHETERIZATION  02/2017  . CARDIAC CATHETERIZATION  08/2016  . CORONARY ANGIOPLASTY WITH STENT PLACEMENT  08/2016     Home Medications:  Prior to Admission medications   Medication Sig Start Date End Date Taking? Authorizing Provider  amLODipine (NORVASC) 10 MG tablet Take 10 mg by mouth daily.   Yes [provider]  aspirin EC 81 MG tablet Take 81 mg by mouth daily.   Yes [provider]  clotrimazole (LOTRIMIN) 1 % cream Apply 1 application topically 2 (two) times daily as needed (yeast infection under breast).   Yes [provider]  furosemide (LASIX) 20 MG tablet Take 20 mg by mouth daily.   Yes [provider]  glimepiride (AMARYL) 2 MG tablet Take 2 mg by mouth daily with breakfast.   Yes [provider]  insulin glargine (LANTUS) 100 UNIT/ML injection Inject 3-15 Units into the skin at bedtime. Per sliding scale   Yes [provider]  lisinopril (PRINIVIL,ZESTRIL) 40 MG tablet Take 40 mg by mouth daily.   Yes [provider]  lovastatin (MEVACOR) 40 MG tablet Take 40 mg by mouth at bedtime.   Yes [provider]  metFORMIN (GLUCOPHAGE) 500 MG tablet Take 500 mg by mouth 2 (two) times daily with a meal.   Yes [provider]  metoprolol tartrate (LOPRESSOR) 50 MG tablet Take 50 mg by mouth 2 (two) times daily.   Yes [provider]  pioglitazone (ACTOS) 15 MG tablet Take 15 mg by mouth daily.   Yes [provider]  ticagrelor (BRILINTA) 90 MG TABS tablet Take 90 mg by mouth 2 (two) times daily.   Yes [provider]  traMADol (ULTRAM) 50 MG tablet Take by mouth every 6 (six) hours as needed for moderate pain.   Yes [provider]    Inpatient Medications: Scheduled Meds:  Continuous Infusions: . heparin 900 Units/hr (05/05/18 1434)   PRN Meds: acetaminophen **OR** acetaminophen, ondansetron **OR** ondansetron (ZOFRAN) IV  Allergies:    Allergies  Allergen Reactions  . Codeine   . Penicillins     Social History:   Social History   Socioeconomic History  . Marital status: Widowed    Spouse name: Not on file  . Number of children: Not on file  . Years of education: Not on file  .  Highest education level: Not on file  Occupational History  . Not on file  Social Needs  . Financial resource strain: Not on file  . Food insecurity:    Worry: Not on file    Inability: Not on file  . Transportation needs:    Medical: Not on file    Non-medical: Not on file  Tobacco Use  . Smoking status: Not on file  Substance and Sexual Activity  . Alcohol use: Not on file  . Drug use: Not on file  . Sexual activity: Not on file  Lifestyle  . Physical activity:    Days per week: Not on file    Minutes per session: Not on file  . Stress: Not on file  Relationships  . Social connections:    Talks on phone: Not on file    Gets together: Not on file    Attends religious service: Not on file    Active member of club or organization: Not on file    Attends meetings of clubs or organizations: Not on file    Relationship status: Not on file  . Intimate partner violence:    Fear of current or ex partner: Not on file    Emotionally abused: Not on file     Physically abused: Not on file    Forced sexual activity: Not on file  Other Topics Concern  . Not on file  Social History Narrative  . Not on file    Family History:    Family History  Problem Relation Age of Onset  . Congestive Heart Failure Sister   . Pneumonia Sister   . Congestive Heart Failure Brother   . Hypertension Brother   . Bone cancer Sister   . COPD Sister   . Colon cancer Sister      ROS:  Please see the history of present illness.   All other ROS reviewed and negative.     Physical Exam/Data:   Vitals:   05/05/18 1315 05/05/18 1511  BP: 116/73 (!) 141/89  Pulse: 61 94  Resp: (!) 24   Temp: 98.8 F (37.1 C) 98.5 F (36.9 C)  TempSrc: Oral Oral  SpO2: 97% 98%   No intake or output data in the 24 hours ending 05/05/18 1528 No flowsheet data found.   There is no height or weight on file to calculate BMI.  General: Obese, elderly female, in no acute distress HEENT: normal Lymph: no adenopathy Neck: no JVD Endocrine:  No thryomegaly Vascular: No carotid bruits; FA pulses 2+ bilaterally without bruits  Cardiac:  normal S1, S2; RRR; no murmur  Lungs: Shallow respirations, few scattered crackles Abd: soft, nontender, no hepatomegaly  Ext: Nonpitting ankle edema Musculoskeletal:  No deformities, BUE and BLE strength normal and equal Skin: warm and dry  Neuro:  CNs 2-12 intact, no focal abnormalities noted Psych: Flat affect   EKG: No EKG located from [place].  Review of notes indicates normal sinus rhythm at 97 bpm with T wave inversions in leads I, aVL and V1.  Telemetry:  Telemetry was personally reviewed and demonstrates: Sinus rhythm in the 90s with PACs  Relevant CV Studies:  Echocardiogram at [facility] Technically difficult study Left ventricular wall thickness is normal Overall left ventricular systolic function is moderate-severely impaired with EF 30-35% Left atrium is severely dilated by volume Moderate mitral  regurgitation Mild tricuspid regurgitation  Left heart cath 09/11/2016 at [place] at [facility] Conclusions Diagnostic Summary Moderate to Severe  stenosis of the LAD & first Diagonal Branch Otherwise non-obstructive coronary artery disease. Normal LV function LV ejection fraction is 60-65 % Interventional Summary Successful PCI / Resolute Onyx Drug Eluting Stent of the proximal LAD Artery. Successful PCI / Resolute Onyx Drug Eluting Stent of the proximal 1st Diagonal Artery. Interventional Recommendations Medical therapy for HTN & CAD Risk factor reduction Dual anti-platelet therapy with Ticagrelor and Aspirin 81 mg for at least 6 months is recommended.  Signatures  Electronically signed by Clarene Critchley, MD  Echocardiogram 02/19/2017 SUMMARY The left ventricular size is normal. LV ejection fraction = 40-45%.Left  ventricular systolic function is mildly reduced. Akinesis of the mid to distal LAD territory ( anteroseptal, anterior,  anterolateral and apex). The right ventricle is normal in size and function. The left atrium is mildly dilated. There is mild mitral regurgitation.There is mild mitral valve annular  dilation. There is moderate tricuspid regurgitation. IVC size was mildly dilated. There is a pleural effusion present. There is no comparison study available. -  Laboratory Data:  ChemistryNo results for input(s): NA, K, CL, CO2, GLUCOSE, BUN, CREATININE, CALCIUM, GFRNONAA, GFRAA, ANIONGAP in the last 168 hours.  No results for input(s): PROT, ALBUMIN, AST, ALT, ALKPHOS, BILITOT in the last 168 hours. HematologyNo results for input(s): WBC, RBC, HGB, HCT, MCV, MCH, MCHC, RDW, PLT in the last 168 hours. Cardiac EnzymesNo results for input(s): TROPONINI in the last 168 hours. No results for input(s): TROPIPOC in the last 168 hours.  BNPNo results for input(s): BNP, PROBNP in the last 168 hours.  DDimer No results for input(s): DDIMER in the  last 168 hours.  Radiology/Studies:  No results found.  Assessment and Plan:   1. NSTEMI -s/p  non-ST elevation MI in May 2018 and under went stent placement to the proximal LAD and first diagonal, on aspirin, Brilinta and statin -Patient presented to [facility] with complaints of shortness of breath and cough and was admitted for DKA and possible pneumonia. -She denies any chest pain or discomfort at any point during this illness -Troponin rose to 42.60 within 12 hours of admission then down trended to the 20s and then peaked again in the 30s.  EKG shows newly reduced LV function -EKG at [facility] reportedly showed T wave inversions in leads I, aVL and V1.  -Echocardiogram shows newly reduced EF -Patient is on a heparin drip and is n.p.o. she is on Plavix. -Patient was transferred to [facility] for further evaluation and treatment. -Patient will need cardiac catheterization. Will give gentle hydration overnight with plan for cardiac cath tomorrow pending renal function.   2. CAD - non-ST elevation MI in May 2018 and under went stent placement to the proximal LAD and first diagonal.  -Medically managed with Brilinta 90 mg twice daily, aspirin 81 mg, beta-blocker, statin, ACE inhibitor at home.  At [facility] she was switched to Plavix and given carvedilol 3.125 mg twice daily.  All other medicines have been held. -She denies any chest discomfort  3. Acute systolic CHF -Newly reduced EF 30-35% -Patient initially did not present with volume overload and was given fluids in the setting of DKA, AKI and possible pneumonia.  Notes from [place] indicate that the patient was net positive about 7 L since admission. -Today at [facility] the patient did appear to be somewhat volume overloaded per chest x-ray and clinical findings.  She was started on IV Lasix 40 mg IV with plan to follow creatinine closely -Currently she does not look significantly volume overloaded.  4. CKD stage III -Notes  indicate baseline creatinine ~1.2.  Creatinine this admission elevated to 1.8 today -Follow renal function closely with daily metabolic panel  5. Hypertension -Home medications include amlodipine 10 mg, Lasix 20 mg daily, lisinopril 40 mg daily, Lopressor 50 mg twice daily, on hold at [place] utilizing hydralazine as needed -Blood pressure currently normal to mildly elevated.  6. Hyperlipidemia -On lovastatin 40 mg daily at home.  Last lipid panel in 02/2017 showed LDL of 85.  Currently on atorvastatin 40 mg daily  7. Diabetes type 2 on insulin -Managed at home with Lantus insulin, glimepiride, pioglitazone -Was treated for DKA at [facility].   8. Atrial fibrillation -Pt on arrival was in sinus rhythm with recurrent PACs, now appears to be in atrial fibrillation.  Will obtain an EKG for confirmation. -CHA2DS2/VAS Stroke Risk Score is at least 8(CHF, HTN, age (2), stroke (2), CAD, female). She is currently on IV heparin.  If A. fib confirmed with the EKG, she will need DOAC initiated prior to discharge     For questions or updates, please contact [place] Please consult [email or web address] for contact info under     Signed, Daune Perch, NP  05/05/2018 3:28 PM  Patient seen and examined and history reviewed. Agree with above findings and plan. 78 yo WF transferred from [facility] with NSTEMI for consideration for cardiac cath. She was admitted there initially with cough and DKA. Aggressively hydrated with over 7 liters. Serial troponins peaked at 40. Unable to visualize Ecg but Ecg reports indicate a variable degree of ST-T changes in the lateral leads. Echo showed EF 30-35%. She denies ever having chest pain. Currently she denies any chest pain or SOB and is lying supine without difficulty. She is s/p stents in LAD and diagonal at [facility] in May 2018. Family reports difficulty with right radial access in the past and left radial access used.   On exam the patient is  morbidly obese. No JVD.  Lungs clear anteriorly. CV IRR without gallop or murmur Abd morbidly obese. No edema. Pulses good throughout.   On telemetry she was initially in NSR with frequent PACs rate 90s. Now appears to be in Afib with no P waves and rate 108.   Laboratory and Xray data reviewed from [facility].  Impression: 1. NSTEMI: Troponin significantly elevated to 40. New LV dysfunction. Will repeat Ecg. Concern is that she may have had stent restenosis versus thrombosis versus stress induced CM. I agree that cardiac cath is warranted. Will gently hydrate tonight for renal insufficiency and repeat BMET in am. If stable we will proceed with cardiac cath tomorrow. The procedure and risks were reviewed including but not limited to death, myocardial infarction, stroke, arrythmias, bleeding, transfusion, emergency surgery, dye allergy, or renal dysfunction. The patient voices understanding and is agreeable to proceed. Given prior difficulty I would consider using left radial approach.  2. Acute LV dysfunction. See #1. Avoid ACEi/ARB now due to CKD. Hold lasix until after cath. 3. Atrial fibrillation. Will check Ecg to confirm. Clearly looks like Afib on Telemetry. She has a very high [place] vasc score and will need anticoagulation post cath. Rate control with beta blocker.  4. DM with DKA. Hospital team to manage.  5. Acute on CKD due to DKA. 6. HTN 7. HLD on high dose lipitor now.    Peter Martinique, [place] 05/05/2018 4:30 PM

## 2018-05-05 NOTE — Progress Notes (Signed)
ANTICOAGULATION CONSULT NOTE - Initial Consult  Pharmacy Consult for heparin  Indication: chest pain/ACS  Allergies  Allergen Reactions  . Codeine   . Penicillins     Patient Measurements: Heparin dosing weight: ~ 72kg Wt= 88kg (1/18 at [facility]) Ht: 5' 3''  Vital Signs: Temp: 98.8 F (37.1 C) (01/20 1315) Temp Source: Oral (01/20 1315) BP: 116/73 (01/20 1315) Pulse Rate: 61 (01/20 1315)  Labs: No results for input(s): HGB, HCT, PLT, APTT, LABPROT, INR, HEPARINUNFRC, HEPRLOWMOCWT, CREATININE, CKTOTAL, CKMB, TROPONINI in the last 72 hours.  CrCl cannot be calculated (No successful lab value found.).    Assessment: 78 yo female to begin heparin for NSTEMI. She was transferred from [place] where according to records sent over, she was on heparin although she is not in heparin currently.   Goal of Therapy:  Heparin level 0.3-0.7 units/ml Monitor platelets by anticoagulation protocol: Yes   Plan:  -Heparin bolus 2000 units IV followed by 900 units/hr  -Heparin level in 8 hours and daily wth CBC daily  Hildred Laser, PharmD Clinical Pharmacist **Pharmacist phone directory can now be found on [email or web address] ([email or web address]).  Listed under [place].

## 2018-05-05 NOTE — Progress Notes (Signed)
Peripherally Inserted Central Catheter/Midline Placement  The IV Nurse has discussed with the patient and/or persons authorized to consent for the patient, the purpose of this procedure and the potential benefits and risks involved with this procedure.  The benefits include less needle sticks, lab draws from the catheter, and the patient may be discharged home with the catheter. Risks include, but not limited to, infection, bleeding, blood clot (thrombus formation), and puncture of an artery; nerve damage and irregular heartbeat and possibility to perform a PICC exchange if needed/ordered by physician.  Alternatives to this procedure were also discussed.  Bard Power PICC patient education guide, fact sheet on infection prevention and patient information card has been provided to patient /or left at bedside.    PICC/Midline Placement Documentation  PICC Double Lumen 05/05/18 PICC Right Brachial 39 cm 1 cm (Active)  Indication for Insertion or Continuance of Line Poor Vasculature-patient has had multiple peripheral attempts or PIVs lasting less than 24 hours 05/05/2018  6:31 PM  Exposed Catheter (cm) 1 cm 05/05/2018  6:31 PM  Site Assessment Clean;Dry;Intact 05/05/2018  6:31 PM  Lumen #1 Status Flushed;Blood return noted 05/05/2018  6:31 PM  Lumen #2 Status Flushed;Blood return noted 05/05/2018  6:31 PM  Dressing Type Transparent 05/05/2018  6:31 PM  Dressing Status Clean;Dry;Intact;Antimicrobial disc in place 05/05/2018  6:31 PM  Dressing Intervention New dressing 05/05/2018  6:31 PM  Dressing Change Due 05/12/18 05/05/2018  6:31 PM       Christella Noa [place]are[place] 05/05/2018, 6:33 PM

## 2018-05-05 NOTE — H&P (Signed)
4        History and Physical    Ashley Fuller MEQ:[id number] DOB: [birth year] DOA: 05/05/2018  PCP: Raina Mina., MD  Patient coming from: [facility].  I have personally briefly reviewed patient's old medical records in [place]  Chief Complaint: NSTEMI  HPI: Ashley Fuller is a 78 y.o. female with medical history significant of CAD, history of an STEMI with stent placement in proximal LAD in May 2018, stage III CKD, type 2 diabetes, hyperlipidemia, hypertension who is transferred from [place] service to this facility after initially presenting there with progressively worse cough, dyspnea and admitted for pneumonia, DKA and lactic acidosis.  The patient initially had a mildly elevated troponin, but the troponin level subsequently did increased significantly all the way to 40 ng/mL.  She denies having any chest pain, dizziness or palpitations, but has been very dyspneic and tired over the past 2 weeks.  She was placed on heparin and Plavix while she was at the transferring facility.  She denies fever, chills headache, sore throat, hemoptysis, nausea, emesis, diarrhea, constipation, melena or hematochezia.  No dysuria, frequency or hematuria.  No polyuria, polydipsia, polyphagia or blurred vision.   Review of Systems: As per HPI otherwise 10 point review of systems negative.   Past Medical History:  Diagnosis Date  . CAD (coronary artery disease)    a. NSTEMI with DES to prox LAD & 1st diag 08/2016  . CKD stage 3 due to type 2 diabetes mellitus ([place])   . Controlled type 2 diabetes mellitus with chronic kidney disease on chronic dialysis, with long-term current use of insulin ([place])   . Hyperlipidemia LDL goal <70   . Hypertension     Past Surgical History:  Procedure Laterality Date  . CARDIAC CATHETERIZATION  02/2017  . CARDIAC CATHETERIZATION  08/2016  . CORONARY ANGIOPLASTY WITH STENT PLACEMENT  08/2016     has no history on file  for tobacco, alcohol, and drug.  Allergies  Allergen Reactions  . Codeine Other (See Comments)  . Penicillins Other (See Comments)    Family History  Problem Relation Age of Onset  . Congestive Heart Failure Sister   . Pneumonia Sister   . Congestive Heart Failure Brother   . Hypertension Brother   . Bone cancer Sister   . COPD Sister   . Colon cancer Sister     Prior to Admission medications   Medication Sig Start Date End Date Taking? Authorizing Provider  amLODipine (NORVASC) 10 MG tablet Take 10 mg by mouth daily.   Yes [provider]  aspirin EC 81 MG tablet Take 81 mg by mouth daily.   Yes [provider]  clotrimazole (LOTRIMIN) 1 % cream Apply 1 application topically 2 (two) times daily as needed (yeast infection under breast).   Yes [provider]  furosemide (LASIX) 20 MG tablet Take 20 mg by mouth daily.   Yes [provider]  glimepiride (AMARYL) 2 MG tablet Take 2 mg by mouth daily with breakfast.   Yes [provider]  insulin glargine (LANTUS) 100 UNIT/ML injection Inject 3-15 Units into the skin at bedtime. Per sliding scale   Yes [provider]  lisinopril (PRINIVIL,ZESTRIL) 40 MG tablet Take 40 mg by mouth daily.   Yes [provider]  lovastatin (MEVACOR) 40 MG tablet Take 40 mg by mouth at bedtime.   Yes [provider]  metFORMIN (GLUCOPHAGE) 500 MG tablet Take 500 mg by  mouth 2 (two) times daily with a meal.   Yes [provider]  metoprolol tartrate (LOPRESSOR) 50 MG tablet Take 50 mg by mouth 2 (two) times daily.   Yes [provider]  pioglitazone (ACTOS) 15 MG tablet Take 15 mg by mouth daily.   Yes [provider]  ticagrelor (BRILINTA) 90 MG TABS tablet Take 90 mg by mouth 2 (two) times daily.   Yes [provider]  traMADol (ULTRAM) 50 MG tablet Take by mouth every 6 (six) hours as needed for moderate pain.   Yes [provider]     Physical Exam: Vitals:   05/05/18 1315 05/05/18 1511  BP: 116/73 (!) 141/89  Pulse: 61 94  Resp: (!) 24   Temp: 98.8 F (37.1 C) 98.5 F (36.9 C)  TempSrc: Oral Oral  SpO2: 97% 98%    Constitutional: NAD, calm, comfortable Eyes: PERRL, lids and conjunctivae normal ENMT: Mucous membranes are moist. Posterior pharynx clear of any exudate or lesions. Neck: normal, supple, no masses, no thyromegaly Respiratory: Decreased breath sounds on bases, mild rhonchi bilaterally, no wheezing, no crackles. Normal respiratory effort. No accessory muscle use.  Cardiovascular: Regular rate and rhythm, no murmurs / rubs / gallops. No extremity edema. 2+ pedal pulses. No carotid bruits.  Abdomen: obese, soft, no tenderness, no masses palpated. No hepatosplenomegaly. Bowel sounds positive.  Musculoskeletal: no clubbing / cyanosis.  Good ROM, no contractures. Normal muscle tone.  Skin: no rashes, lesions, ulcers on very limited dermatological examination. Neurologic: CN 2-12 grossly intact. Sensation intact, DTR normal.  Generalized weakness. Psychiatric: Normal judgment and insight. Alert and oriented x 3. Normal mood.   Labs on Admission: I have personally reviewed following labs and imaging studies  CBC: No results for input(s): WBC, NEUTROABS, HGB, HCT, MCV, PLT in the last 168 hours. Basic Metabolic Panel: No results for input(s): NA, K, CL, CO2, GLUCOSE, BUN, CREATININE, CALCIUM, MG, PHOS in the last 168 hours. GFR: CrCl cannot be calculated (No successful lab value found.). Liver Function Tests: No results for input(s): AST, ALT, ALKPHOS, BILITOT, PROT, ALBUMIN in the last 168 hours. No results for input(s): LIPASE, AMYLASE in the last 168 hours. No results for input(s): AMMONIA in the last 168 hours. Coagulation Profile: No results for input(s): INR, PROTIME in the last 168 hours. Cardiac Enzymes: No results for input(s): CKTOTAL, CKMB, CKMBINDEX, TROPONINI in the last 168 hours. BNP  (last 3 results) No results for input(s): PROBNP in the last 8760 hours. HbA1C: No results for input(s): HGBA1C in the last 72 hours. CBG: Recent Labs  Lab 05/05/18 1329  GLUCAP 178*   Lipid Profile: No results for input(s): CHOL, HDL, LDLCALC, TRIG, CHOLHDL, LDLDIRECT in the last 72 hours. Thyroid Function Tests: No results for input(s): TSH, T4TOTAL, FREET4, T3FREE, THYROIDAB in the last 72 hours. Anemia Panel: No results for input(s): VITAMINB12, FOLATE, FERRITIN, TIBC, IRON, RETICCTPCT in the last 72 hours. Urine analysis: No results found for: COLORURINE, APPEARANCEUR, LABSPEC, PHURINE, GLUCOSEU, HGBUR, BILIRUBINUR, KETONESUR, PROTEINUR, UROBILINOGEN, NITRITE, LEUKOCYTESUR  Radiological Exams on Admission: No results found.  EKG: Independently reviewed.  No EKG tracing from [facility] was located.  Assessment/Plan Principal Problem:   NSTEMI (non-ST elevated myocardial infarction) ([place])   CAD (coronary artery disease) Admit to progressive unit/inpatient. Continue supplemental oxygen. Continue heparin infusion. Cardiology consult appreciated. For cardiac cath tomorrow.  Active Problems:   Controlled type 2 diabetes mellitus with chronic kidney disease on chronic dialysis, with long-term current use of insulin ([place]) Continue  Amaryl 2 mg p.o. daily. Continue metformin 500 mg p.o. twice daily. Continue Actos 15 mg p.o. daily    Hyperlipidemia LDL goal <70 Continue pravastatin 40 mg p.o. daily.    Hypertension Continue amlodipine 10 mg p.o. daily. Continue lisinopril 40 mg p.o. daily. Continue metoprolol 50 mg p.o. twice daily.    CKD stage 3 due to type 2 diabetes mellitus ([facility]) Monitor renal function and electrolytes   DVT prophylaxis: On heparin infusion. Code Status: Full code. Family Communication: Her daughter was present in the room and provided most of the history. Disposition Plan: Accepted on transfer for cardiology evaluation/cardiac  cath. Consults called: Cardiology service. Admission status: Inpatient/progressive unit.   Reubin Milan MD  05/05/2018, 5:01 PM

## 2018-05-05 NOTE — H&P (View-Only) (Signed)
Cardiology Consultation:   Ashley Fuller ID: Ashley Ashley Fuller MRN: [id number]; DOB: 1940/09/23  Admit date: 05/05/2018 Date of Consult: 05/05/2018  Primary Care Provider: Raina Fuller., MD Primary Cardiologist: No primary care provider on file.  Dr. Otho Fuller, [facility] in [place] Primary Electrophysiologist:  None    Ashley [name]:   Ashley Ashley Fuller is a 78 y.o. female with a hx of CAD s/p NSTEMI with DES to LAD and D1 08/2016, PAF in setting of pneumonia, hyperlipidemia, CVA, type 2 diabetes, CKD stage III who is being seen today for Ashley evaluation of NSTEMI at Ashley request of Dr. Olevia Ashley Fuller.  History of Present Illness:   Ashley Ashley Fuller has a history of non-ST elevation MI in May 2018 and under went stent placement to Ashley proximal LAD and first diagonal.  In November 2018 she was feeling somewhat ill per record review and was transferred to [facility] where she underwent a repeat cardiac catheterization that demonstrated patent stent sites with some mid LAD disease which was managed medically.  She apparently had pneumonia at that time and was treated for that.  There was concerned by home physical therapy that she was getting bradycardic so a Holter monitor was checked and demonstrated no significant bradycardia.  She is noted to be very sedentary, sitting watching TV most of Ashley time.  Ashley presented to Ashley emergency department at [facility] due to complaints of worsening cough and chest soreness due to cough that had been ongoing for almost 2 weeks and with associated shortness of breath.  Cough was productive of white sputum and worsened at night.  Ashley [name] with a worse coughing fit at dinner with associated chest soreness.  It was noted that at baseline Ashley Ashley Fuller ambulates with a walker and lives at home with her son and family.  Initially her blood pressure was elevated at 180/115 and she was tachycardic and tachypneic.  She was also hypoxic per ABG.  She had  leukocytosis and elevated BUN/creatinine 38/1.6 with baseline creatinine 1.1-1.2.  She was hyperglycemic with anion gap of 21 and elevated lactic acid.  EKG showed normal sinus rhythm at 97 bpm with T wave inversions in leads I, aVL and V1.  She was started on breathing treatments, IV hydration and IV antibiotics.  Flu swab was negative.  She was also started on an IV insulin drip and placed on BiPAP. Chest x-ray on 05/04/2018 showed patchy perihilar lung opacities, slightly improved, which may represent improving pulmonary edema, although a component of pneumonia is not excluded.  Stable small right pleural effusion.  Stable mild cardiomegaly.  She was admitted for treatment of acute hypoxic respiratory failure and DKA.  She was found to have an elevated troponinI 3.11> 18.10  > 42.60> 35.30> 20.60> 25.60. Echocardiogram was technically difficult, showed normal LV wall thickness, moderately-severely impaired LV function with EF 30-35%, left atrium severely dilated, moderate mitral regurgitation and mild tricuspid regurgitation.  She was seen in Cardiology consultation by Dr. Geraldo Ashley Fuller and Ashley Ashley Fuller was transferred from [facility] to [facility] for further management and treatment of NSTEMI.   Upon my examination Ashley Ashley Fuller denies any chest pain or discomfort at any point during this illness.  She does have chronic shortness of breath, worsened at presentation, seems to be somewhat better now.  Ashley Ashley Fuller is not very conversant but denies any overall discomfort.  Her daughter is very knowledgeable and does most of Ashley talking.  Ashley daughter reports that Ashley Ashley Fuller has been  on Brilinta and aspirin however at this next refill of Brilinta Ashley cost was to be greater than $500 so she called Dr. Otho Fuller who told her that Ashley Ashley Fuller could stop Brilinta and switch to aspirin 325 mg daily.  Just prior to admission she was still taking Ashley Brilinta as she had not yet run out.  Ashley Ashley Fuller has never  smoked although her husband smoked and she was exposed to secondhand smoke.  She does not drink alcohol.  She uses a walker at home and is mostly sedentary.  Dr. Geraldo Ashley Fuller put Ashley Ashley Fuller on Plavix while at [facility].  This would be a more affordable choice for Ashley Ashley Fuller.  Past Medical History:  Diagnosis Date  . CAD (coronary artery disease)    a. NSTEMI with DES to prox LAD & 1st diag 08/2016  . CKD stage 3 due to type 2 diabetes mellitus ([place])   . Controlled type 2 diabetes mellitus with chronic kidney disease on chronic dialysis, with long-term current use of insulin ([place])   . Hyperlipidemia LDL goal <70   . Hypertension     Past Surgical History:  Procedure Laterality Date  . CARDIAC CATHETERIZATION  02/2017  . CARDIAC CATHETERIZATION  08/2016  . CORONARY ANGIOPLASTY WITH STENT PLACEMENT  08/2016     Home Medications:  Prior to Admission medications   Medication Sig Start Date End Date Taking? Authorizing Provider  amLODipine (NORVASC) 10 MG tablet Take 10 mg by mouth daily.   Yes [provider]  aspirin EC 81 MG tablet Take 81 mg by mouth daily.   Yes [provider]  clotrimazole (LOTRIMIN) 1 % cream Apply 1 application topically 2 (two) times daily as needed (yeast infection under breast).   Yes [provider]  furosemide (LASIX) 20 MG tablet Take 20 mg by mouth daily.   Yes [provider]  glimepiride (AMARYL) 2 MG tablet Take 2 mg by mouth daily with breakfast.   Yes [provider]  insulin glargine (LANTUS) 100 UNIT/ML injection Inject 3-15 Units into Ashley skin at bedtime. Per sliding scale   Yes [provider]  lisinopril (PRINIVIL,ZESTRIL) 40 MG tablet Take 40 mg by mouth daily.   Yes [provider]  lovastatin (MEVACOR) 40 MG tablet Take 40 mg by mouth at bedtime.   Yes [provider]  metFORMIN (GLUCOPHAGE) 500 MG tablet Take 500 mg by mouth 2 (two) times daily with a meal.   Yes [provider]  metoprolol tartrate (LOPRESSOR) 50 MG tablet Take 50 mg by mouth 2 (two) times daily.   Yes [provider]  pioglitazone (ACTOS) 15 MG tablet Take 15 mg by mouth daily.   Yes [provider]  ticagrelor (BRILINTA) 90 MG TABS tablet Take 90 mg by mouth 2 (two) times daily.   Yes [provider]  traMADol (ULTRAM) 50 MG tablet Take by mouth every 6 (six) hours as needed for moderate pain.   Yes [provider]    Inpatient Medications: Scheduled Meds:  Continuous Infusions: . heparin 900 Units/hr (05/05/18 1434)   PRN Meds: acetaminophen **OR** acetaminophen, ondansetron **OR** ondansetron (ZOFRAN) IV  Allergies:    Allergies  Allergen Reactions  . Codeine   . Penicillins     Social History:   Social History   Socioeconomic History  . Marital status: Widowed    Spouse name: Not on file  . Number of children: Not on file  . Years of education: Not on file  .  Highest education level: Not on file  Occupational History  . Not on file  Social Needs  . Financial resource strain: Not on file  . Food insecurity:    Worry: Not on file    Inability: Not on file  . Transportation needs:    Medical: Not on file    Non-medical: Not on file  Tobacco Use  . Smoking status: Not on file  Substance and Sexual Activity  . Alcohol use: Not on file  . Drug use: Not on file  . Sexual activity: Not on file  Lifestyle  . Physical activity:    Days per week: Not on file    Minutes per session: Not on file  . Stress: Not on file  Relationships  . Social connections:    Talks on phone: Not on file    Gets together: Not on file    Attends religious service: Not on file    Active member of club or organization: Not on file    Attends meetings of clubs or organizations: Not on file    Relationship status: Not on file  . Intimate partner violence:    Fear of current or ex partner: Not on file    Emotionally abused: Not on file     Physically abused: Not on file    Forced sexual activity: Not on file  Other Topics Concern  . Not on file  Social History Narrative  . Not on file    Family History:    Family History  Problem Relation Age of Onset  . Congestive Heart Failure Sister   . Pneumonia Sister   . Congestive Heart Failure Brother   . Hypertension Brother   . Bone cancer Sister   . COPD Sister   . Colon cancer Sister      ROS:  Please see Ashley history of present illness.   All other ROS reviewed and negative.     Physical Exam/Data:   Vitals:   05/05/18 1315 05/05/18 1511  BP: 116/73 (!) 141/89  Pulse: 61 94  Resp: (!) 24   Temp: 98.8 F (37.1 C) 98.5 F (36.9 C)  TempSrc: Oral Oral  SpO2: 97% 98%   No intake or output data in Ashley 24 hours ending 05/05/18 1528 No flowsheet data found.   There is no height or weight on file to calculate BMI.  General: Obese, elderly female, in no acute distress HEENT: normal Lymph: no adenopathy Neck: no JVD Endocrine:  No thryomegaly Vascular: No carotid bruits; FA pulses 2+ bilaterally without bruits  Cardiac:  normal S1, S2; RRR; no murmur  Lungs: Shallow respirations, few scattered crackles Abd: soft, nontender, no hepatomegaly  Ext: Nonpitting ankle edema Musculoskeletal:  No deformities, BUE and BLE strength normal and equal Skin: warm and dry  Neuro:  CNs 2-12 intact, no focal abnormalities noted Psych: Flat affect   EKG: No EKG located from [place].  Review of notes indicates normal sinus rhythm at 97 bpm with T wave inversions in leads I, aVL and V1.  Telemetry:  Telemetry was personally reviewed and demonstrates: Sinus rhythm in Ashley 90s with PACs  Relevant CV Studies:  Echocardiogram at [facility] Technically difficult study Left ventricular wall thickness is normal Overall left ventricular systolic function is moderate-severely impaired with EF 30-35% Left atrium is severely dilated by volume Moderate mitral  regurgitation Mild tricuspid regurgitation  Left heart cath 09/11/2016 at [place] at [facility] Conclusions Diagnostic Summary Moderate to Severe  stenosis of Ashley LAD & first Diagonal Branch Otherwise non-obstructive coronary artery disease. Normal LV function LV ejection fraction is 60-65 % Interventional Summary Successful PCI / Resolute Onyx Drug Eluting Stent of Ashley proximal LAD Artery. Successful PCI / Resolute Onyx Drug Eluting Stent of Ashley proximal 1st Diagonal Artery. Interventional Recommendations Medical therapy for HTN & CAD Risk factor reduction Dual anti-platelet therapy with Ticagrelor and Aspirin 81 mg for at least 6 months is recommended.  Signatures  Electronically signed by Clarene Critchley, MD  Echocardiogram 02/19/2017 SUMMARY Ashley left ventricular size is normal. LV ejection fraction = 40-45%.Left  ventricular systolic function is mildly reduced. Akinesis of Ashley mid to distal LAD territory ( anteroseptal, anterior,  anterolateral and apex). Ashley right ventricle is normal in size and function. Ashley left atrium is mildly dilated. There is mild mitral regurgitation.There is mild mitral valve annular  dilation. There is moderate tricuspid regurgitation. IVC size was mildly dilated. There is a pleural effusion present. There is no comparison study available. -  Laboratory Data:  ChemistryNo results for input(s): NA, K, CL, CO2, GLUCOSE, BUN, CREATININE, CALCIUM, GFRNONAA, GFRAA, ANIONGAP in Ashley last 168 hours.  No results for input(s): PROT, ALBUMIN, AST, ALT, ALKPHOS, BILITOT in Ashley last 168 hours. HematologyNo results for input(s): WBC, RBC, HGB, HCT, MCV, MCH, MCHC, RDW, PLT in Ashley last 168 hours. Cardiac EnzymesNo results for input(s): TROPONINI in Ashley last 168 hours. No results for input(s): TROPIPOC in Ashley last 168 hours.  BNPNo results for input(s): BNP, PROBNP in Ashley last 168 hours.  DDimer No results for input(s): DDIMER in Ashley  last 168 hours.  Radiology/Studies:  No results found.  Assessment and Plan:   1. NSTEMI -s/p  non-ST elevation MI in May 2018 and under went stent placement to Ashley proximal LAD and first diagonal, on aspirin, Brilinta and statin -Ashley [name] to [facility] with complaints of shortness of breath and cough and was admitted for DKA and possible pneumonia. -She denies any chest pain or discomfort at any point during this illness -Troponin rose to 42.60 within 12 hours of admission then down trended to Ashley 20s and then peaked again in Ashley 30s.  EKG shows newly reduced LV function -EKG at [facility] reportedly showed T wave inversions in leads I, aVL and V1.  -Echocardiogram shows newly reduced EF -Ashley Fuller is on a heparin drip and is n.p.o. she is on Plavix. -Ashley Fuller was transferred to [facility] for further evaluation and treatment. -Ashley Fuller will need cardiac catheterization. Will give gentle hydration overnight with plan for cardiac cath tomorrow pending renal function.   2. CAD - non-ST elevation MI in May 2018 and under went stent placement to Ashley proximal LAD and first diagonal.  -Medically managed with Brilinta 90 mg twice daily, aspirin 81 mg, beta-blocker, statin, ACE inhibitor at home.  At [facility] she was switched to Plavix and given carvedilol 3.125 mg twice daily.  All other medicines have been held. -She denies any chest discomfort  3. Acute systolic CHF -Newly reduced EF 30-35% -Ashley Fuller initially did not present with volume overload and was given fluids in Ashley setting of DKA, AKI and possible pneumonia.  Notes from [place] indicate that Ashley Ashley Fuller was net positive about 7 L since admission. -Today at [facility] Ashley Ashley Fuller did appear to be somewhat volume overloaded per chest x-ray and clinical findings.  She was started on IV Lasix 40 mg IV with plan to follow creatinine closely -Currently she does not look significantly volume overloaded.  4. CKD stage III -Notes  indicate baseline creatinine ~1.2.  Creatinine this admission elevated to 1.8 today -Follow renal function closely with daily metabolic panel  5. Hypertension -Home medications include amlodipine 10 mg, Lasix 20 mg daily, lisinopril 40 mg daily, Lopressor 50 mg twice daily, on hold at [place] utilizing hydralazine as needed -Blood pressure currently normal to mildly elevated.  6. Hyperlipidemia -On lovastatin 40 mg daily at home.  Last lipid panel in 02/2017 showed LDL of 85.  Currently on atorvastatin 40 mg daily  7. Diabetes type 2 on insulin -Managed at home with Lantus insulin, glimepiride, pioglitazone -Was treated for DKA at [facility].   8. Atrial fibrillation -Pt on arrival was in sinus rhythm with recurrent PACs, now appears to be in atrial fibrillation.  Will obtain an EKG for confirmation. -CHA2DS2/VAS Stroke Risk Score is at least 8(CHF, HTN, age (2), stroke (2), CAD, female). She is currently on IV heparin.  If A. fib confirmed with Ashley EKG, she will need DOAC initiated prior to discharge     For questions or updates, please contact [place] Please consult [email or web address] for contact info under     Signed, Daune Perch, NP  05/05/2018 3:28 PM  Ashley Fuller seen and examined and history reviewed. Agree with above findings and plan. 78 yo WF transferred from [facility] with NSTEMI for consideration for cardiac cath. She was admitted there initially with cough and DKA. Aggressively hydrated with over 7 liters. Serial troponins peaked at 40. Unable to visualize Ecg but Ecg reports indicate a variable degree of ST-T changes in Ashley lateral leads. Echo showed EF 30-35%. She denies ever having chest pain. Currently she denies any chest pain or SOB and is lying supine without difficulty. She is s/p stents in LAD and diagonal at [facility] in May 2018. Family reports difficulty with right radial access in Ashley past and left radial access used.   On exam Ashley Ashley Fuller is  morbidly obese. No JVD.  Lungs clear anteriorly. CV IRR without gallop or murmur Abd morbidly obese. No edema. Pulses good throughout.   On telemetry she was initially in NSR with frequent PACs rate 90s. Now appears to be in Afib with no P waves and rate 108.   Laboratory and Xray data reviewed from [facility].  Impression: 1. NSTEMI: Troponin significantly elevated to 40. New LV dysfunction. Will repeat Ecg. Concern is that she may have had stent restenosis versus thrombosis versus stress induced CM. I agree that cardiac cath is warranted. Will gently hydrate tonight for renal insufficiency and repeat BMET in am. If stable we will proceed with cardiac cath tomorrow. Ashley procedure and risks were reviewed including but not limited to death, myocardial infarction, stroke, arrythmias, bleeding, transfusion, emergency surgery, dye allergy, or renal dysfunction. Ashley Ashley Fuller voices understanding and is agreeable to proceed. Given prior difficulty I would consider using left radial approach.  2. Acute LV dysfunction. See #1. Avoid ACEi/ARB now due to CKD. Hold lasix until after cath. 3. Atrial fibrillation. Will check Ecg to confirm. Clearly looks like Afib on Telemetry. She has a very high [place] vasc score and will need anticoagulation post cath. Rate control with beta blocker.  4. DM with DKA. Hospital team to manage.  5. Acute on CKD due to DKA. 6. HTN 7. HLD on high dose lipitor now.    Elmus Mathes [place], [place] 05/05/2018 4:30 PM

## 2018-05-05 NOTE — Progress Notes (Signed)
CRITICAL VALUE ALERT  Critical Value:  Troponin 12.77  Date & Time Notied: 05/05/2018 9:55 PM  Provider Notified: Lamar Blinks, NP  Orders Received/Actions taken:

## 2018-05-06 ENCOUNTER — Encounter (HOSPITAL_COMMUNITY)
Admission: AD | Disposition: A | Discharge status: Skilled Nursing Facility | Payer: Self-pay | Source: Other Acute Inpatient Hospital | Attending: Internal Medicine

## 2018-05-06 ENCOUNTER — Other Ambulatory Visit: Payer: Self-pay

## 2018-05-06 ENCOUNTER — Encounter (HOSPITAL_COMMUNITY): Payer: Self-pay | Admitting: Interventional Cardiology

## 2018-05-06 DIAGNOSIS — E1122 Type 2 diabetes mellitus with diabetic chronic kidney disease: Secondary | ICD-10-CM

## 2018-05-06 DIAGNOSIS — I5021 Acute systolic (congestive) heart failure: Secondary | ICD-10-CM

## 2018-05-06 DIAGNOSIS — I10 Essential (primary) hypertension: Secondary | ICD-10-CM

## 2018-05-06 DIAGNOSIS — I214 Non-ST elevation (NSTEMI) myocardial infarction: Principal | ICD-10-CM

## 2018-05-06 DIAGNOSIS — I2510 Atherosclerotic heart disease of native coronary artery without angina pectoris: Secondary | ICD-10-CM

## 2018-05-06 DIAGNOSIS — E785 Hyperlipidemia, unspecified: Secondary | ICD-10-CM

## 2018-05-06 DIAGNOSIS — N183 Chronic kidney disease, stage 3 (moderate): Secondary | ICD-10-CM

## 2018-05-06 HISTORY — PX: CORONARY STENT INTERVENTION: CATH118234

## 2018-05-06 HISTORY — PX: RIGHT/LEFT HEART CATH AND CORONARY ANGIOGRAPHY: CATH118266

## 2018-05-06 LAB — CBC WITH DIFFERENTIAL/PLATELET
Abs Immature Granulocytes: 0.05 10*3/uL (ref 0.00–0.07)
BASOS ABS: 0 10*3/uL (ref 0.0–0.1)
Basophils Relative: 0 %
Eosinophils Absolute: 0 10*3/uL (ref 0.0–0.5)
Eosinophils Relative: 1 %
HCT: 31.4 % — ABNORMAL LOW (ref 36.0–46.0)
Hemoglobin: 9.7 g/dL — ABNORMAL LOW (ref 12.0–15.0)
IMMATURE GRANULOCYTES: 1 %
Lymphocytes Relative: 18 %
Lymphs Abs: 1.4 10*3/uL (ref 0.7–4.0)
MCH: 29 pg (ref 26.0–34.0)
MCHC: 30.9 g/dL (ref 30.0–36.0)
MCV: 93.7 fL (ref 80.0–100.0)
Monocytes Absolute: 0.7 10*3/uL (ref 0.1–1.0)
Monocytes Relative: 9 %
NEUTROS ABS: 5.5 10*3/uL (ref 1.7–7.7)
Neutrophils Relative %: 71 %
Platelets: 217 10*3/uL (ref 150–400)
RBC: 3.35 MIL/uL — ABNORMAL LOW (ref 3.87–5.11)
RDW: 15.4 % (ref 11.5–15.5)
WBC: 7.7 10*3/uL (ref 4.0–10.5)
nRBC: 0 % (ref 0.0–0.2)

## 2018-05-06 LAB — BASIC METABOLIC PANEL
Anion gap: 9 (ref 5–15)
BUN: 48 mg/dL — ABNORMAL HIGH (ref 8–23)
CO2: 17 mmol/L — ABNORMAL LOW (ref 22–32)
Calcium: 8.2 mg/dL — ABNORMAL LOW (ref 8.9–10.3)
Chloride: 113 mmol/L — ABNORMAL HIGH (ref 98–111)
Creatinine, Ser: 1.86 mg/dL — ABNORMAL HIGH (ref 0.44–1.00)
GFR calc Af Amer: 30 mL/min — ABNORMAL LOW (ref >60)
GFR calc non Af Amer: 25 mL/min — ABNORMAL LOW (ref >60)
Glucose, Bld: 234 mg/dL — ABNORMAL HIGH (ref 70–99)
POTASSIUM: 4.4 mmol/L (ref 3.5–5.1)
Sodium: 139 mmol/L (ref 135–145)

## 2018-05-06 LAB — GLUCOSE, CAPILLARY
GLUCOSE-CAPILLARY: 108 mg/dL — AB (ref 70–99)
Glucose-Capillary: 224 mg/dL — ABNORMAL HIGH (ref 70–99)
Glucose-Capillary: 150 mg/dL — ABNORMAL HIGH (ref 70–99)
Glucose-Capillary: 139 mg/dL — ABNORMAL HIGH (ref 70–99)

## 2018-05-06 LAB — POCT ACTIVATED CLOTTING TIME
Activated Clotting Time: 235 seconds
Activated Clotting Time: 246 seconds
Activated Clotting Time: 296 seconds

## 2018-05-06 LAB — HEPARIN LEVEL (UNFRACTIONATED): HEPARIN UNFRACTIONATED: 0.36 [IU]/mL (ref 0.30–0.70)

## 2018-05-06 LAB — TROPONIN I: Troponin I: 15.07 ng/mL (ref <0.03)

## 2018-05-06 SURGERY — RIGHT/LEFT HEART CATH AND CORONARY ANGIOGRAPHY
Anesthesia: LOCAL

## 2018-05-06 MED ORDER — HEPARIN SODIUM (PORCINE) 1000 UNIT/ML IJ SOLN
INTRAMUSCULAR | Status: AC
  Filled 2018-05-06: qty 1

## 2018-05-06 MED ORDER — SODIUM CHLORIDE 0.9 % WEIGHT BASED INFUSION
0.5300 mL/kg/h | INTRAVENOUS | Status: AC
  Administered 2018-05-06: 17:00:00 0.53 mL/kg/h via INTRAVENOUS

## 2018-05-06 MED ORDER — CLOPIDOGREL BISULFATE 75 MG PO TABS
75.0000 mg | ORAL_TABLET | Freq: Every day | ORAL | Status: DC
  Administered 2018-05-07 – 2018-05-09 (×3): 75 mg via ORAL
  Filled 2018-05-06 (×3): qty 1

## 2018-05-06 MED ORDER — OXYCODONE HCL 5 MG PO TABS: 5.0000 mg | ORAL_TABLET | ORAL | Status: DC | PRN

## 2018-05-06 MED ORDER — HEPARIN (PORCINE) IN NACL 1000-0.9 UT/500ML-% IV SOLN
INTRAVENOUS | Status: AC
  Filled 2018-05-06: qty 1000

## 2018-05-06 MED ORDER — ASPIRIN EC 81 MG PO TBEC
81.0000 mg | DELAYED_RELEASE_TABLET | Freq: Every day | ORAL | Status: DC
  Administered 2018-05-07 – 2018-05-09 (×3): 81 mg via ORAL
  Filled 2018-05-06 (×3): qty 1

## 2018-05-06 MED ORDER — SODIUM CHLORIDE 0.9% FLUSH
3.0000 mL | Freq: Two times a day (BID) | INTRAVENOUS | Status: DC
  Administered 2018-05-06 – 2018-05-08 (×5): 3 mL via INTRAVENOUS

## 2018-05-06 MED ORDER — IOHEXOL 350 MG/ML SOLN
INTRAVENOUS | Status: DC | PRN
  Administered 2018-05-06: 115 mL via INTRAVENOUS

## 2018-05-06 MED ORDER — HYDRALAZINE HCL 20 MG/ML IJ SOLN: 5.0000 mg | INTRAMUSCULAR | Status: AC | PRN

## 2018-05-06 MED ORDER — HEPARIN (PORCINE) IN NACL 1000-0.9 UT/500ML-% IV SOLN
INTRAVENOUS | Status: DC | PRN
  Administered 2018-05-06 (×3): 500 mL

## 2018-05-06 MED ORDER — VERAPAMIL HCL 2.5 MG/ML IV SOLN
INTRAVENOUS | Status: AC
  Filled 2018-05-06: qty 2

## 2018-05-06 MED ORDER — HEPARIN (PORCINE) IN NACL 1000-0.9 UT/500ML-% IV SOLN
INTRAVENOUS | Status: AC
  Filled 2018-05-06: qty 500

## 2018-05-06 MED ORDER — LIDOCAINE HCL (PF) 1 % IJ SOLN
INTRAMUSCULAR | Status: AC
  Filled 2018-05-06: qty 30

## 2018-05-06 MED ORDER — LIDOCAINE HCL (PF) 1 % IJ SOLN
INTRAMUSCULAR | Status: DC | PRN
  Administered 2018-05-06: 2 mL

## 2018-05-06 MED ORDER — INSULIN GLARGINE 100 UNIT/ML ~~LOC~~ SOLN
8.0000 [IU] | Freq: Every day | SUBCUTANEOUS | Status: DC
  Administered 2018-05-06 – 2018-05-08 (×3): 8 [IU] via SUBCUTANEOUS
  Filled 2018-05-06 (×5): qty 0.08

## 2018-05-06 MED ORDER — ACETAMINOPHEN 325 MG PO TABS: 650.0000 mg | ORAL_TABLET | ORAL | Status: DC | PRN

## 2018-05-06 MED ORDER — LABETALOL HCL 5 MG/ML IV SOLN: 10.0000 mg | INTRAVENOUS | Status: AC | PRN

## 2018-05-06 MED ORDER — SODIUM CHLORIDE 0.9 % IV SOLN: 250.0000 mL | INTRAVENOUS | Status: DC | PRN

## 2018-05-06 MED ORDER — ANGIOPLASTY BOOK
Freq: Once | Status: AC
  Administered 2018-05-06: 23:00:00
  Filled 2018-05-06: qty 1

## 2018-05-06 MED ORDER — HEPARIN SODIUM (PORCINE) 5000 UNIT/ML IJ SOLN
5000.0000 [IU] | Freq: Three times a day (TID) | INTRAMUSCULAR | Status: DC
  Administered 2018-05-07 – 2018-05-09 (×8): 5000 [IU] via SUBCUTANEOUS
  Filled 2018-05-06 (×8): qty 1

## 2018-05-06 MED ORDER — HEART ATTACK BOUNCING BOOK
Freq: Once | Status: AC
  Administered 2018-05-06: 23:00:00
  Filled 2018-05-06: qty 1

## 2018-05-06 MED ORDER — VERAPAMIL HCL 2.5 MG/ML IV SOLN
INTRAVENOUS | Status: DC | PRN
  Administered 2018-05-06: 10 mL via INTRA_ARTERIAL

## 2018-05-06 MED ORDER — ONDANSETRON HCL 4 MG/2ML IJ SOLN: 4.0000 mg | Freq: Four times a day (QID) | INTRAMUSCULAR | Status: DC | PRN

## 2018-05-06 MED ORDER — ORAL CARE MOUTH RINSE
15.0000 mL | Freq: Two times a day (BID) | OROMUCOSAL | Status: DC
  Administered 2018-05-06 – 2018-05-09 (×6): 15 mL via OROMUCOSAL

## 2018-05-06 MED ORDER — CLOPIDOGREL BISULFATE 300 MG PO TABS
ORAL_TABLET | ORAL | Status: AC
  Filled 2018-05-06: qty 2

## 2018-05-06 MED ORDER — SODIUM CHLORIDE 0.9% FLUSH: 3.0000 mL | INTRAVENOUS | Status: DC | PRN

## 2018-05-06 MED ORDER — HEPARIN SODIUM (PORCINE) 1000 UNIT/ML IJ SOLN
INTRAMUSCULAR | Status: DC | PRN
  Administered 2018-05-06: 2500 [IU] via INTRAVENOUS
  Administered 2018-05-06 (×2): 5000 [IU] via INTRAVENOUS
  Administered 2018-05-06: 1500 [IU] via INTRAVENOUS
  Administered 2018-05-06: 2000 [IU] via INTRAVENOUS

## 2018-05-06 MED ORDER — CLOPIDOGREL BISULFATE 300 MG PO TABS
ORAL_TABLET | ORAL | Status: DC | PRN
  Administered 2018-05-06: 600 mg via ORAL

## 2018-05-06 SURGICAL SUPPLY — 20 items
BALLN SAPPHIRE ~~LOC~~ 3.25X12 (BALLOONS) ×2 IMPLANT
BALLN ~~LOC~~ EUPHORA RX 3.0X12 (BALLOONS) ×2
BALLOON ~~LOC~~ EUPHORA RX 3.0X12 (BALLOONS) ×1 IMPLANT
CATH INFINITI 5FR MULTPACK ANG (CATHETERS) ×2 IMPLANT
CATH VISTA GUIDE 6FR XBLAD3.5 (CATHETERS) ×2 IMPLANT
DEVICE RAD COMP TR BAND LRG (VASCULAR PRODUCTS) ×2 IMPLANT
DRAPE ZERO GRAVITY STERILE (DRAPES) ×2 IMPLANT
GLIDESHEATH SLEND A-KIT 6F 22G (SHEATH) ×2 IMPLANT
GUIDEWIRE INQWIRE 1.5J.035X260 (WIRE) ×1 IMPLANT
HOVERMATT SINGLE USE (MISCELLANEOUS) ×2 IMPLANT
INQWIRE 1.5J .035X260CM (WIRE) ×2
KIT ENCORE 26 ADVANTAGE (KITS) ×2 IMPLANT
KIT HEART LEFT (KITS) ×2 IMPLANT
PACK CARDIAC CATHETERIZATION (CUSTOM PROCEDURE TRAY) ×2 IMPLANT
SHEATH PROBE COVER 6X72 (BAG) ×2 IMPLANT
STENT RESOLUTE ONYX 3.0X12 (Permanent Stent) ×2 IMPLANT
TRANSDUCER W/STOPCOCK (MISCELLANEOUS) ×2 IMPLANT
TUBING CIL FLEX 10 FLL-RA (TUBING) ×2 IMPLANT
WIRE ASAHI PROWATER 180CM (WIRE) ×2 IMPLANT
WIRE HI TORQ VERSACORE-J 145CM (WIRE) ×2 IMPLANT

## 2018-05-06 NOTE — Progress Notes (Signed)
Progress Note  Patient Name: Ashley Fuller Date of Encounter: 05/06/2018  Primary Cardiologist: Dr. Otho Perl, [facility] in [place]   Pt feeling well today.  Denies chest pain or shortness of breath.  Wants to go home.   Inpatient Medications    Scheduled Meds: . amLODipine  10 mg Oral Daily  . furosemide  20 mg Oral Daily  . glimepiride  2 mg Oral Q breakfast  . insulin aspart  0-15 Units Subcutaneous TID WC  . lisinopril  40 mg Oral Daily  . mouth rinse  15 mL Mouth Rinse BID  . metoprolol tartrate  50 mg Oral BID  . pioglitazone  15 mg Oral Daily  . pravastatin  40 mg Oral q1800  . sodium chloride flush  10-40 mL Intracatheter Q12H  . sodium chloride flush  3 mL Intravenous Q12H   Continuous Infusions: . sodium chloride    . sodium chloride 50 mL/hr at 05/05/18 2032  . heparin 1,100 Units/hr (05/05/18 2319)   PRN Meds: sodium chloride, acetaminophen **OR** acetaminophen, nitroGLYCERIN, ondansetron **OR** ondansetron (ZOFRAN) IV, sodium chloride flush, sodium chloride flush, traMADol   Vital Signs    Vitals:   05/06/18 0103 05/06/18 0131 05/06/18 0429 05/06/18 0802  BP:   (!) 91/49 121/73  Pulse:  73 75   Resp:  20 (!) 23   Temp:   97.7 F (36.5 C)   TempSrc:   Oral   SpO2:  100% 100%   Weight: 94.6 kg     Height: 5\' 4"  (1.626 m)       Intake/Output Summary (Last 24 hours) at 05/06/2018 0826 Last data filed at 05/06/2018 0700 Gross per 24 hour  Intake 937.19 ml  Output 350 ml  Net 587.19 ml   Filed Weights   05/06/18 0103  Weight: 94.6 kg    Physical Exam   General: Elderly,  NAD Skin: Warm, dry, intact  Head: Normocephalic, atraumatic, clear, moist mucus membranes. Neck: Negative for carotid bruits. No JVD Lungs:Clear to ausculation bilaterally. No wheezes, rales, or rhonchi. Breathing is unlabored. Cardiovascular: RRR with S1 S2. No murmurs, rubs, gallops, or LV heave appreciated. Abdomen: Soft, non-tender, non-distended with  normoactive bowel sounds. No obvious abdominal masses. MSK: Strength and tone appear normal for age. 5/5 in all extremities Extremities: No edema. No clubbing or cyanosis. DP/PT pulses 2+ bilaterally Neuro: Alert and oriented. No focal deficits. No facial asymmetry. MAE spontaneously. Psych: Responds to questions appropriately with normal affect.    Labs    Chemistry Recent Labs  Lab 05/05/18 2018 05/06/18 0524  NA 141 139  K 4.4 4.4  CL 113* 113*  CO2 15* 17*  GLUCOSE 276* 234*  BUN 46* 48*  CREATININE 1.91* 1.86*  CALCIUM 8.2* 8.2*  PROT 5.2*  --   ALBUMIN 2.5*  --   AST 67*  --   ALT 91*  --   ALKPHOS 72  --   BILITOT 0.7  --   GFRNONAA 25* 25*  GFRAA 29* 30*  ANIONGAP 13 9    Hematology Recent Labs  Lab 05/05/18 2018 05/06/18 0524  WBC 8.1 7.7  RBC 3.49* 3.35*  HGB 10.1* 9.7*  HCT 32.9* 31.4*  MCV 94.3 93.7  MCH 28.9 29.0  MCHC 30.7 30.9  RDW 15.5 15.4  PLT 212 217   Cardiac Enzymes Recent Labs  Lab 05/05/18 2018 05/06/18 0208  TROPONINI 12.77* 15.07*   No results for input(s): TROPIPOC in the last 168 hours.  BNPNo results for input(s): BNP, PROBNP in the last 168 hours.   DDimer No results for input(s): DDIMER in the last 168 hours.   Radiology    [place] Ekg Site Rite  Result Date: 05/05/2018 [facility] image not attached, placement could not be confirmed due to current cardiac rhythm.  Telemetry    05/07/18 NSR with occasional PAC's Personally Reviewed  ECG    No new tracing as of 05/07/18 - Personally Reviewed  Cardiac Studies   Echocardiogram at [facility] Technically difficult study Left ventricular wall thickness is normal Overall left ventricular systolic function is moderate-severely impaired with EF 30-35% Left atrium is severely dilated by volume Moderate mitral regurgitation Mild tricuspid regurgitation  Left heart cath 09/11/2016 at [place] at [facility] Conclusions Diagnostic  Summary Moderate to Severe stenosis of the LAD & first Diagonal Branch Otherwise non-obstructive coronary artery disease. Normal LV function LV ejection fraction is 60-65 % Interventional Summary Successful PCI / Resolute Onyx Drug Eluting Stent of the proximal LAD Artery. Successful PCI / Resolute Onyx Drug Eluting Stent of the proximal 1st Diagonal Artery. Interventional Recommendations Medical therapy for HTN & CAD Risk factor reduction Dual anti-platelet therapy with Ticagrelor and Aspirin 81 mg for at least 6 months is recommended.  Signatures  Electronically signed by Clarene Critchley, MD  Echocardiogram 02/19/2017 SUMMARY The left ventricular size is normal. LV ejection fraction = 40-45%.Left  ventricular systolic function is mildly reduced. Akinesis of the mid to distal LAD territory ( anteroseptal, anterior,  anterolateral and apex). The right ventricle is normal in size and function. The left atrium is mildly dilated. There is mild mitral regurgitation.There is mild mitral valve annular  dilation. There is moderate tricuspid regurgitation. IVC size was mildly dilated. There is a pleural effusion present. There is no comparison study available.  Patient Profile     78 y.o. female with a hx of CAD s/p NSTEMI with DES to LAD and D1 08/2016, PAF in setting of pneumonia, hyperlipidemia, CVA, type 2 diabetes, CKD stage III who is being seen today for the evaluation of NSTEMI at the request of Dr. Olevia Bowens.  Assessment & Plan    1. NSTEMI with new LV dysfunciton: -Trop found to be significantly elevated at 42.60 with new LV dysfunction with concern for restenosis versus thrombosis versus stress induced CM.     -Patient presented to [facility] with complaints of shortness of breath and cough and was admitted for DKA and possible pneumonia. -Echocardiogram with newly reduced LV function to 30-35% -Cardiac catheterization performed 05/06/18 with severe in-stent restenosis  in proximal LAD stent which was undersized, now s/p re-expansion and stenting with DES with recommendations for DAPT for at least one year with  -Continue ASA, Plavix, metoprolol>>consider transitioning to Toprol XL -Will need HF medication titration with possible Spironolactone with anticipation of needing Entresto and monitor renal function  -Will stop amlodipine in the setting of above   2. Acute systolic CHF: -Newly reduced EF 30-35% per echocardiogram  -Patient initially did not present with volume overload and was given fluids in the setting of DKA, AKI and possible pneumonia. Notes from [place] indicate that the patient was net positive 7 L since admission. -Does not appear to be markedly fluid volume overloaded on exam -Weight, 208lb on 05/06/18  -I&O, net positive 151ml tsince admission  -When BMET results>>if creatinine stable, consider initiation Entresto   3. CKD stage III: -Creatinine, 1.7 today  -Baseline appears to be in the 1.2  range  -Follow renal function closely with daily metabolic panel  4. Hypertension: -Stable, 142/72>134/68>127/70 -Continue amlodipine, BB -Will consider adding Entresto to regimen if creatinine stable   5. Hyperlipidemia: -Stable, last lipid panel with LDL at 85 -Will increase Lipitor to high intensity in the setting of CAD   6. DM2: -Hb A1c, 8.7 -Managed at home with Lantus insulin, glimepiride, pioglitazone -On SSI for glucose control while inpatient status>>continue  -Per IM  7. Possible Atrial fibrillation: -There was questionable AF on admission with difficult to distinquish P waves -EKG with NSR and NSR with no episodes of AF per tele review  -NSR with occasional PAC's  -Continue to monitor closely  -CHA2DS2VASc =at least 8 (CHF, HTN, age (2), stroke (2), CAD, female).  -No DOAC given no recurrence     Signed, Kathyrn Drown NP-C [facility] Pager: [phone] 05/06/2018, 8:26 AM     For questions or updates, please  contact   Please consult [email or web address] for contact info under Cardiology/STEMI.

## 2018-05-06 NOTE — Progress Notes (Signed)
Progress Note  Patient Name: Ashley Fuller Date of Encounter: 05/06/2018  Primary Cardiologist: No primary care provider on file. New. Dr. Otho Perl in [place] well today. Breathing is ok. No chest pain  Inpatient Medications    Scheduled Meds: . amLODipine  10 mg Oral Daily  . [START ON 05/07/2018] aspirin EC  81 mg Oral Daily  . [START ON 05/07/2018] clopidogrel  75 mg Oral Q breakfast  . furosemide  20 mg Oral Daily  . [START ON 05/07/2018] heparin  5,000 Units Subcutaneous Q8H  . insulin aspart  0-15 Units Subcutaneous TID WC  . mouth rinse  15 mL Mouth Rinse BID  . metoprolol tartrate  50 mg Oral BID  . pravastatin  40 mg Oral q1800  . sodium chloride flush  10-40 mL Intracatheter Q12H  . sodium chloride flush  3 mL Intravenous Q12H   Continuous Infusions: . sodium chloride    . sodium chloride     PRN Meds: sodium chloride, acetaminophen, hydrALAZINE, labetalol, nitroGLYCERIN, ondansetron **OR** ondansetron (ZOFRAN) IV, oxyCODONE, sodium chloride flush, sodium chloride flush, traMADol   Vital Signs    Vitals:   05/06/18 1118 05/06/18 1122 05/06/18 1127 05/06/18 1220  BP: (!) 147/82 (!) 146/79  124/70  Pulse: 71 71 73   Resp: (!) 53 (!) 30 (!) 4   Temp:    97.9 F (36.6 C)  TempSrc:    Oral  SpO2: 96% (!) 0% (!) 0%   Weight:      Height:        Intake/Output Summary (Last 24 hours) at 05/06/2018 1308 Last data filed at 05/06/2018 0700 Gross per 24 hour  Intake 937.19 ml  Output 350 ml  Net 587.19 ml   Last 3 Weights 05/06/2018  Weight (lbs) 208 lb 8 oz  Weight (kg) 94.575 kg      Telemetry    NSR. No Afib noted.  - Personally Reviewed  ECG    NSR, LAD, anterior infarct age undetermined - Personally Reviewed  Physical Exam   GEN: Obese WF No acute distress.   Neck: No JVD Cardiac: RRR, no murmurs, rubs, or gallops.  Respiratory: Clear to auscultation bilaterally. GI: Soft, nontender, non-distended  MS: No edema; No  deformity. Right radial cath site without hematoma. Neuro:  Nonfocal  Psych: Normal affect   Labs    Chemistry Recent Labs  Lab 05/05/18 2018 05/06/18 0524  NA 141 139  K 4.4 4.4  CL 113* 113*  CO2 15* 17*  GLUCOSE 276* 234*  BUN 46* 48*  CREATININE 1.91* 1.86*  CALCIUM 8.2* 8.2*  PROT 5.2*  --   ALBUMIN 2.5*  --   AST 67*  --   ALT 91*  --   ALKPHOS 72  --   BILITOT 0.7  --   GFRNONAA 25* 25*  GFRAA 29* 30*  ANIONGAP 13 9     Hematology Recent Labs  Lab 05/05/18 2018 05/06/18 0524  WBC 8.1 7.7  RBC 3.49* 3.35*  HGB 10.1* 9.7*  HCT 32.9* 31.4*  MCV 94.3 93.7  MCH 28.9 29.0  MCHC 30.7 30.9  RDW 15.5 15.4  PLT 212 217    Cardiac Enzymes Recent Labs  Lab 05/05/18 2018 05/06/18 0208  TROPONINI 12.77* 15.07*   No results for input(s): TROPIPOC in the last 168 hours.   BNPNo results for input(s): BNP, PROBNP in the last 168 hours.   DDimer No results for input(s): DDIMER in the  last 168 hours.   Radiology    [place] Ekg Site Rite  Result Date: 05/05/2018 [facility] image not attached, placement could not be confirmed due to current cardiac rhythm.   Cardiac Studies   Echocardiogram at [facility] Technically difficult study Left ventricular wall thickness is normal Overall left ventricular systolic function is moderate-severely impaired with EF 30-35% Left atrium is severely dilated by volume Moderate mitral regurgitation Mild tricuspid regurgitation  Cardiac cath/PCI: Conclusion    Non-ST elevation MI in the setting of DKA induced stress on the substrate of type proximal to mid LAD restenosis due to stent underexpansion from the procedure in 2018.  PCI and restenting reduced to 95% stenosis to less than 10% with TIMI grade III flow using a 3.0 x 12 mm Onyx and postdilated with a 3.25 x 12 Sapphire to 18 atm.  Left main is widely patent.  The LAD contains diffuse disease beyond the first diagonal origin up to 60 to 70%.  The first  diagonal is large and the stent in the first diagonal is widely patent.  The circumflex gives origin to 3 obtuse marginal branches.  The mid circumflex contains an eccentric hazy 50% stenosis.  The right coronary contains diffuse 30 to 50% narrowing proximal to distal.  LVEDP 18 mmHg.  RECOMMENDATIONS:   Aspirin and Plavix for at least 12 months.  Aggressive risk factor modification.  Follow kidney function closely.  IV fluid, 50 cc/h for 18 hours.  Watch for any evidence of volume overload.     Patient Profile     78 y.o. female with a hx of CAD s/p NSTEMI with DES to LAD and D1 08/2016, PAF in setting of pneumonia, hyperlipidemia, CVA, type 2 diabetes, CKD stage III who is being seen today for the evaluation of NSTEMI  Assessment & Plan    1. NSTEMI -s/p  non-ST elevation MI in May 2018 and under went stent placement to the proximal LAD and first diagonal -Patient presented to [facility] with complaints of shortness of breath and cough and was admitted for DKA and possible pneumonia. -She denied any chest pain or discomfort at any point during this illness -Troponin rose to 42.60 within 12 hours of admission then down trended to the 20s and then peaked again in the 30s.  EKG shows newly reduced LV function -EKG at [facility] reportedly showed T wave inversions in leads I, aVL and V1.  -Echocardiogram shows newly reduced EF -Cardiac cath today shows severe in stent restenosis in proximal LAD stent which was undersized. S/p re-expansion and stenting with DES.  -Plan DAPT for one year at least.   2. Acute systolic CHF -Newly reduced EF 30-35% -Patient initially did not present with volume overload and was given fluids in the setting of DKA, AKI and possible pneumonia.  Notes from [place] indicate that the patient was net positive about 7 L since admission. -Currently she does not look significantly volume overloaded. - EDP at cath was 18 mm Hg.  - not a candidate  for ACEi or ARB at this time due to AKI. May consider once renal function returned to baseline. Continue beta blocker. Hopefully EF will improve with improved myocardial perfusion.  3. CKD stage III -Notes indicate baseline creatinine ~1.2. rose to 1.9 then 1.8 today.  -Follow renal function closely with daily metabolic panel given dye load today 105 cc  6. Hypertension -Home medications include amlodipine 10 mg, Lasix 20 mg daily, lisinopril 40 mg daily, Lopressor 50  mg twice daily, on hold at [place] utilizing hydralazine as needed -Now on metoprolol only.  6. Hyperlipidemia -On lovastatin 40 mg daily at home.  Last lipid panel in 02/2017 showed LDL of 85.  Currently on atorvastatin 40 mg daily  7. Diabetes type 2 on insulin -Managed at home with Lantus insulin, glimepiride, pioglitazone -Recent  DKA at [facility].  - per hospitalist team  8. ? Atrial fibrillation -appeared to have some Afib on telemetry on admit but P waves difficult to see. By Ecg was in NSR. NSR on telemetry. Will monitor closely. If she does have Afib will need to consider anticoagulation given high [place] Vas score.  -CHA2DS2/VAS Stroke Risk Score is at least 8(CHF, HTN, age (2), stroke (2), CAD, female).        For questions or updates, please contact [place] Please consult [email or web address] for contact info under        Signed,  [place], MD  05/06/2018, 1:08 PM

## 2018-05-06 NOTE — CV Procedure (Signed)
   Left radial access using real-time vascular ultrasound.  Left heart cath with left heart hemodynamic assessment in the setting of non-ST elevation MI.  PCI proximal to mid LAD due to severe ISR in an angiographically/fluoroscopically underexpanded LAD stent.  Successful PTCA and restenting of the proximal to mid LAD using a 12 x 3.0 Onyx postdilated to 3.25 mm.  Required 17 atm using a 3.25 x 12 mm Sapphire noncompliant to overcome the stenosis.  Less than 10% stenosis with TIMI grade III flow noted.  The right coronary artery and circumflex are patent.  Left main is also widely patent.  LVEDP 18 mmHg.  Total 115 cc of contrast.

## 2018-05-06 NOTE — Progress Notes (Signed)
Inpatient Diabetes Program Recommendations  AACE/ADA: New Consensus Statement on Inpatient Glycemic Control (2015)  Target Ranges:  Prepandial:   less than 140 mg/dL      Peak postprandial:   less than 180 mg/dL (1-2 hours)      Critically ill patients:  140 - 180 mg/dL   Lab Results  Component Value Date   GLUCAP 224 (H) 05/06/2018    Review of Glycemic Control Results for Ashley Fuller, Ashley Fuller (MRN [id number]) as of 05/06/2018 09:33  Ref. Range 05/05/2018 13:29 05/05/2018 17:12 05/05/2018 21:10 05/06/2018 07:35  Glucose-Capillary Latest Ref Range: 70 - 99 mg/dL 178 (H) 203 (H) 228 (H) 224 (H)   Diabetes history: Type 2 DM Outpatient Diabetes medications: Amaryl 2 mg QAM, Actos 15 mg QD, Lantus 3-15 units QHS, Metformin 500 mg BID Current orders for Inpatient glycemic control: Amaryl 2 mg QAM, Actos 15 mg QD, Novolog 0-15 units TID  Inpatient Diabetes Program Recommendations:    Most recent A1C was 8.3% from 11/18/17, consider repeating A1C.  Additionally, would recommend discontinuing Metformin at discharge in lieu of GFR <35. Given the patient's high risk of edema and weight gain in the setting of acute systolic CHF, would proceed with caution at discharge with Actos.   At this time, recommend adding Lantus 8 units QHS. Discontinue Metformin and Actos.  Will continue to follow.   Thanks, Bronson Curb, MSN, RNC-OB Diabetes Coordinator [phone] (8a-5p)

## 2018-05-06 NOTE — Progress Notes (Signed)
TRBAND REMOVAL  LOCATION:    left radial  DEFLATED PER PROTOCOL:    Yes.    TIME BAND OFF / DRESSING APPLIED:    1545   SITE UPON ARRIVAL:    Level 0  SITE AFTER BAND REMOVAL:    Level 1( SMALL BRUISE)  CIRCULATION SENSATION AND MOVEMENT:    Within Normal Limits   Yes.    COMMENTS:   Tolerated procedure well

## 2018-05-06 NOTE — Interval H&P Note (Signed)
Cath Lab Visit (complete for each Cath Lab visit)  Clinical Evaluation Leading to the Procedure:   ACS: Yes.    Non-ACS:    Anginal Classification: CCS IV  Anti-ischemic medical therapy: Maximal Therapy (2 or more classes of medications)  Non-Invasive Test Results: No non-invasive testing performed  Prior CABG: No previous CABG      History and Physical Interval Note:  05/06/2018 8:51 AM  Ashley Fuller  has presented today for surgery, with the diagnosis of ns - reduced ef  The various methods of treatment have been discussed with the patient and family. After consideration of risks, benefits and other options for treatment, the patient has consented to  Procedure(s): RIGHT/LEFT HEART CATH AND CORONARY ANGIOGRAPHY (N/A) as a surgical intervention .  The patient's history has been reviewed, patient examined, no change in status, stable for surgery.  I have reviewed the patient's chart and labs.  Questions were answered to the patient's satisfaction.     Belva Crome III

## 2018-05-06 NOTE — Plan of Care (Signed)
  Problem: Clinical Measurements: Goal: Will remain free from infection Outcome: Progressing Note:  No s/s of infection noted. Goal: Respiratory complications will improve Outcome: Progressing Note:  No s/s of respiratory complications noted.  Stable on 2L/[place].   Problem: Coping: Goal: Level of anxiety will decrease Outcome: Progressing Note:  No s/s of anxiety noted.

## 2018-05-06 NOTE — Progress Notes (Signed)
[place] TEAM 1 - Stepdown/ICU TEAM  Ashley Fuller  QBV:[id number] DOB: [birth year] DOA: 05/05/2018 PCP: Raina Mina., MD    Brief Narrative:  78 y.o. female w/ a hx of CAD s/p stent to proximal LAD in May 2018, stage III CKD, DM2, hyperlipidemia, and hypertension who was transferred from [facility] after initially presenting there with pneumonia, DKA and lactic acidosis.  The patient initially had a mildly elevated troponin, but the troponin level subsequently increased to 40.   Significant Events: 1/20 transfer to [facility] 1/21 cardiac cath   Subjective: Resting comfortably post-cath.   Assessment & Plan:  NSTEMI - CAD  Care per Cardiology s/p PCI proximal to mid LAD due to severe ISR in an angiographically/fluoroscopically underexpanded LAD stent > successful PTCA and restenting of the proximal to mid LAD using a 12 x 3.0 Onyx postdilated to 2.80 mm  Acute systolic CHF Newly reduced EF at 30-35% - care per Cardiology   DM2 CBG reasonably - follow trend - stop oral meds at this time - does not appear appropriate for Actos (CHF) or metformin (low GFR) at time of d/c   CKD 3 Baseline crt ~1.2 - follow now s/p cath   HLD Cont atorvastatin  HTN BP currently reasonably controlled   Normocytic anemia Likely related to CKD - check anemia panel   DVT prophylaxis: SQ heparin  Code Status: FULL CODE Family Communication: no family present at time of exam  Disposition Plan: per Cards   Consultants:  [facility] Cardiology   Antimicrobials:  none   Objective: Blood pressure 127/70, pulse 73, temperature 98 F (36.7 C), temperature source Oral, resp. rate (!) 4, height 5\' 4"  (1.626 m), weight 94.6 kg, SpO2 (!) 0 %.  Intake/Output Summary (Last 24 hours) at 05/06/2018 1713 Last data filed at 05/06/2018 1444 Gross per 24 hour  Intake 1297.19 ml  Output 350 ml  Net 947.19 ml   Filed Weights   05/06/18 0103  Weight: 94.6 kg    Examination: General: No acute  respiratory distress Lungs: Clear to auscultation bilaterally without wheezes or crackles Cardiovascular: Regular rate and rhythm without murmur gallop or rub normal S1 and S2 Abdomen: Nontender, nondistended, soft, bowel sounds positive, no rebound, no ascites, no appreciable mass Extremities: No significant cyanosis, clubbing, or edema bilateral lower extremities  CBC: Recent Labs  Lab 05/05/18 2018 05/06/18 0524  WBC 8.1 7.7  NEUTROABS 6.3 5.5  HGB 10.1* 9.7*  HCT 32.9* 31.4*  MCV 94.3 93.7  PLT 212 034   Basic Metabolic Panel: Recent Labs  Lab 05/05/18 2018 05/06/18 0524  NA 141 139  K 4.4 4.4  CL 113* 113*  CO2 15* 17*  GLUCOSE 276* 234*  BUN 46* 48*  CREATININE 1.91* 1.86*  CALCIUM 8.2* 8.2*   GFR: Estimated Creatinine Clearance: 27.8 mL/min (A) (by C-G formula based on SCr of 1.86 mg/dL (H)).  Liver Function Tests: Recent Labs  Lab 05/05/18 2018  AST 67*  ALT 91*  ALKPHOS 72  BILITOT 0.7  PROT 5.2*  ALBUMIN 2.5*    Cardiac Enzymes: Recent Labs  Lab 05/05/18 2018 05/06/18 0208  TROPONINI 12.77* 15.07*    CBG: Recent Labs  Lab 05/05/18 1712 05/05/18 2110 05/06/18 0735 05/06/18 1037 05/06/18 1219  GLUCAP 203* 228* 224* 150* 139*    Scheduled Meds: . amLODipine  10 mg Oral Daily  . [START ON 05/07/2018] aspirin EC  81 mg Oral Daily  . [START ON 05/07/2018] clopidogrel  75 mg Oral  Q breakfast  . furosemide  20 mg Oral Daily  . [START ON 05/07/2018] heparin  5,000 Units Subcutaneous Q8H  . insulin aspart  0-15 Units Subcutaneous TID WC  . mouth rinse  15 mL Mouth Rinse BID  . metoprolol tartrate  50 mg Oral BID  . pravastatin  40 mg Oral q1800  . sodium chloride flush  10-40 mL Intracatheter Q12H  . sodium chloride flush  3 mL Intravenous Q12H     LOS: 1 day   Cherene Altes, MD [facility] Office  [phone] Pager - Text Page per Shea Evans  If 7PM-7AM, please contact night-coverage per Amion 05/06/2018, 5:13 PM

## 2018-05-07 DIAGNOSIS — I25119 Atherosclerotic heart disease of native coronary artery with unspecified angina pectoris: Secondary | ICD-10-CM

## 2018-05-07 DIAGNOSIS — I5043 Acute on chronic combined systolic (congestive) and diastolic (congestive) heart failure: Secondary | ICD-10-CM

## 2018-05-07 LAB — HEMOGLOBIN A1C
Hgb A1c MFr Bld: 8.7 % — ABNORMAL HIGH (ref 4.8–5.6)
Mean Plasma Glucose: 202.99 mg/dL

## 2018-05-07 LAB — BASIC METABOLIC PANEL
Anion gap: 10 (ref 5–15)
BUN: 47 mg/dL — ABNORMAL HIGH (ref 8–23)
CO2: 17 mmol/L — AB (ref 22–32)
Calcium: 8.2 mg/dL — ABNORMAL LOW (ref 8.9–10.3)
Chloride: 115 mmol/L — ABNORMAL HIGH (ref 98–111)
Creatinine, Ser: 1.71 mg/dL — ABNORMAL HIGH (ref 0.44–1.00)
GFR calc Af Amer: 33 mL/min — ABNORMAL LOW (ref >60)
GFR calc non Af Amer: 28 mL/min — ABNORMAL LOW (ref >60)
Glucose, Bld: 154 mg/dL — ABNORMAL HIGH (ref 70–99)
Potassium: 4 mmol/L (ref 3.5–5.1)
Sodium: 142 mmol/L (ref 135–145)

## 2018-05-07 LAB — GLUCOSE, CAPILLARY
Glucose-Capillary: 156 mg/dL — ABNORMAL HIGH (ref 70–99)
Glucose-Capillary: 140 mg/dL — ABNORMAL HIGH (ref 70–99)
Glucose-Capillary: 172 mg/dL — ABNORMAL HIGH (ref 70–99)
Glucose-Capillary: 156 mg/dL — ABNORMAL HIGH (ref 70–99)
Glucose-Capillary: 191 mg/dL — ABNORMAL HIGH (ref 70–99)

## 2018-05-07 LAB — RETICULOCYTES
Immature Retic Fract: 27.3 % — ABNORMAL HIGH (ref 2.3–15.9)
RBC.: 3.44 MIL/uL — ABNORMAL LOW (ref 3.87–5.11)
RETIC CT PCT: 1.9 % (ref 0.4–3.1)
Retic Count, Absolute: 64.7 10*3/uL (ref 19.0–186.0)

## 2018-05-07 LAB — VITAMIN B12: VITAMIN B 12: 203 pg/mL (ref 180–914)

## 2018-05-07 LAB — LIPID PANEL
Cholesterol: 146 mg/dL (ref 0–200)
HDL: 42 mg/dL (ref >40)
LDL Cholesterol: 78 mg/dL (ref 0–99)
Total CHOL/HDL Ratio: 3.5 RATIO
Triglycerides: 132 mg/dL (ref <150)
VLDL: 26 mg/dL (ref 0–40)

## 2018-05-07 LAB — IRON AND TIBC
Iron: 53 ug/dL (ref 28–170)
Saturation Ratios: 22 % (ref 10.4–31.8)
TIBC: 241 ug/dL — ABNORMAL LOW (ref 250–450)
UIBC: 188 ug/dL

## 2018-05-07 LAB — FOLATE: Folate: 9.7 ng/mL (ref >5.9)

## 2018-05-07 LAB — FERRITIN: Ferritin: 78 ng/mL (ref 11–307)

## 2018-05-07 MED ORDER — SODIUM CHLORIDE 0.9 % IV SOLN: 250.0000 mL | INTRAVENOUS | Status: DC | PRN

## 2018-05-07 NOTE — Progress Notes (Signed)
CARDIAC REHAB PHASE I   PRE:  Rate/Rhythm: 88 irreg with occ PVCs  BP:  Sitting: 137/67      SaO2: 95 RA  MODE:  Ambulation: 3 ft (side step up the bed)  POST:  Rate/Rhythm: 86 irreg with occ PVCs  BP:  Sitting: 136/79    SaO2: 95 RA   Attempted to walk with pt. Pt assisted to edge of bed, and to stand. Pt sat back down, and stated she was tired. Encouraged pt to take a few feet up the bed. Pt then needed to lie down and rest. Pt c/o SOB and feeling lazy and weak. Pt denies CP. Reviewed importance of ASA, and Plavix. Pt given MI book, CHF booklet, heart healthy and diabetic diets. Reviewed restrictions. Asked RN for PT eval. Will continue to follow and encourage ambulation.  [phone] Rufina Falco, RN BSN 05/07/2018 8:50 AM

## 2018-05-07 NOTE — Progress Notes (Signed)
[place] TEAM 1 - Stepdown/ICU TEAM  Ashley Fuller  EZM:[id number] DOB: [birth year] DOA: 05/05/2018 PCP: Raina Mina., MD    Brief Narrative:  78 y.o. female w/ a hx of CAD s/p stent to proximal LAD in May 2018, stage III CKD, DM2, hyperlipidemia, and hypertension who was transferred from [facility] after initially presenting there with pneumonia, DKA and lactic acidosis.  The patient initially had a mildly elevated troponin, but the troponin level subsequently increased to 40.   Significant Events: 1/20 transfer to [facility] 1/21 cardiac cath   Subjective: She is oriented x2.  Denies having any complaints at this time.  Assessment & Plan:  NSTEMI - CAD  Care per Cardiology. S/p PCI proximal to mid LAD due to severe ISR in an angiographically/fluoroscopically underexpanded LAD stent > successful PTCA and restenting of the proximal to mid LAD using a 12 x 3.0 Onyx postdilated to 3.25 mm -Per cardiology aspirin and Plavix for at least 12 months.  Aggressive risk factor modification.  Acute systolic CHF Newly reduced EF at 30-35% - care per Cardiology   DM2 CBG reasonably - follow trend - stop oral meds at this time - does not appear appropriate for Actos (CHF) or metformin (low GFR). Reevaluate at time of d/c  -Patient advised to follow-up with her outpatient physician.  Acute on chronic renal failure: Patient has baseline CKD 3 -She had elevated creatinine.  Status post cardiac cath.  Improving. -Per cardiology continue with gentle IV hydration for the next 18 hours.  We will continue to monitor BUN/creatinine.  HLD Cont atorvastatin  HTN -Continue beta-blocker.  Not a candidate for ACEI or ARB due to acute on chronic renal failure.  BP currently reasonably controlled -Continue to monitor blood pressure closely and adjust medications as needed.  Normocytic anemia Likely related to CKD. -Anemia panel okay except for mildly reduced TIBC  DVT prophylaxis: SQ  heparin  Code Status: FULL CODE Family Communication: no family present at time of exam  Disposition Plan: PT consulted by Cards. Likely home when stable   Consultants:  [facility]   Antimicrobials:  none   Objective: Blood pressure 124/75, pulse 63, temperature (!) 97.4 F (36.3 C), temperature source Oral, resp. rate (!) 24, height 5\' 4"  (1.626 m), weight 95 kg, SpO2 96 %.  Intake/Output Summary (Last 24 hours) at 05/07/2018 1158 Last data filed at 05/07/2018 0700 Gross per 24 hour  Intake 1388.18 ml  Output 1700 ml  Net -311.82 ml   Filed Weights   05/06/18 0103 05/07/18 0651  Weight: 94.6 kg 95 kg    Examination: General: No acute distress Lungs: Clear to auscultation bilaterally without wheezes or crackles Cardiovascular: Regular rate and rhythm without murmur gallop or rub normal S1 and S2 Abdomen: Nontender, nondistended, soft, bowel sounds positive, no rebound, no ascites, no appreciable mass Extremities: No significant cyanosis, clubbing, or edema bilateral lower extremities Neuro: Patient oriented x2.  Appears to be grossly nonfocal.  CBC: Recent Labs  Lab 05/05/18 2018 05/06/18 0524  WBC 8.1 7.7  NEUTROABS 6.3 5.5  HGB 10.1* 9.7*  HCT 32.9* 31.4*  MCV 94.3 93.7  PLT 212 503   Basic Metabolic Panel: Recent Labs  Lab 05/05/18 2018 05/06/18 0524 05/07/18 0456  NA 141 139 142  K 4.4 4.4 4.0  CL 113* 113* 115*  CO2 15* 17* 17*  GLUCOSE 276* 234* 154*  BUN 46* 48* 47*  CREATININE 1.91* 1.86* 1.71*  CALCIUM 8.2* 8.2* 8.2*  GFR: Estimated Creatinine Clearance: 30.3 mL/min (A) (by C-G formula based on SCr of 1.71 mg/dL (H)).  Liver Function Tests: Recent Labs  Lab 05/05/18 2018  AST 67*  ALT 91*  ALKPHOS 72  BILITOT 0.7  PROT 5.2*  ALBUMIN 2.5*    Cardiac Enzymes: Recent Labs  Lab 05/05/18 2018 05/06/18 0208  TROPONINI 12.77* 15.07*    CBG: Recent Labs  Lab 05/06/18 1037 05/06/18 1219 05/06/18 1652 05/06/18 2235  05/07/18 0647  GLUCAP 150* 139* 156* 108* 140*    Scheduled Meds: . amLODipine  10 mg Oral Daily  . aspirin EC  81 mg Oral Daily  . clopidogrel  75 mg Oral Q breakfast  . furosemide  20 mg Oral Daily  . heparin  5,000 Units Subcutaneous Q8H  . insulin aspart  0-15 Units Subcutaneous TID WC  . insulin glargine  8 Units Subcutaneous QHS  . mouth rinse  15 mL Mouth Rinse BID  . metoprolol tartrate  50 mg Oral BID  . pravastatin  40 mg Oral q1800  . sodium chloride flush  10-40 mL Intracatheter Q12H  . sodium chloride flush  3 mL Intravenous Q12H     LOS: 2 days   Yaakov Guthrie, MD [facility] Office  [phone] Pager - Text Page per Shea Evans  If 7PM-7AM, please contact night-coverage per Amion 05/07/2018, 11:58 AM

## 2018-05-07 NOTE — Progress Notes (Signed)
Progress Note  Patient Name: Ashley Fuller Date of Encounter: 05/07/2018  Primary Cardiologist: No primary care provider on file. New. Dr. Otho Perl in [place] well today. Breathing is ok. No chest pain.   Inpatient Medications    Scheduled Meds: . amLODipine  10 mg Oral Daily  . aspirin EC  81 mg Oral Daily  . clopidogrel  75 mg Oral Q breakfast  . furosemide  20 mg Oral Daily  . heparin  5,000 Units Subcutaneous Q8H  . insulin aspart  0-15 Units Subcutaneous TID WC  . insulin glargine  8 Units Subcutaneous QHS  . mouth rinse  15 mL Mouth Rinse BID  . metoprolol tartrate  50 mg Oral BID  . pravastatin  40 mg Oral q1800  . sodium chloride flush  10-40 mL Intracatheter Q12H  . sodium chloride flush  3 mL Intravenous Q12H   Continuous Infusions: . sodium chloride     PRN Meds: sodium chloride, acetaminophen, nitroGLYCERIN, ondansetron **OR** ondansetron (ZOFRAN) IV, oxyCODONE, sodium chloride flush, sodium chloride flush, traMADol   Vital Signs    Vitals:   05/06/18 2004 05/06/18 2121 05/07/18 0651 05/07/18 0700  BP: 134/68 (!) 142/72 (!) 148/68 (!) 148/68  Pulse: 83 78 75 75  Resp: (!) 26  (!) 22 20  Temp: 98.9 F (37.2 C)  98 F (36.7 C) 97.8 F (36.6 C)  TempSrc: Oral  Oral Oral  SpO2: 96%  94% 95%  Weight:   95 kg   Height:        Intake/Output Summary (Last 24 hours) at 05/07/2018 0853 Last data filed at 05/07/2018 0700 Gross per 24 hour  Intake 1388.18 ml  Output 1700 ml  Net -311.82 ml   Last 3 Weights 05/07/2018 05/06/2018  Weight (lbs) 209 lb 7 oz 208 lb 8 oz  Weight (kg) 95 kg 94.575 kg      Telemetry    NSR. No Afib noted.  - Personally Reviewed  ECG    NSR, LAD, anterior infarct age undetermined - Personally Reviewed  Physical Exam   GEN: Obese WF No acute distress.   Neck: No JVD Cardiac: RRR, no murmurs, rubs, or gallops.  Respiratory: Clear to auscultation bilaterally. GI: Soft, nontender, non-distended    MS: No edema; No deformity. Right radial cath site without hematoma. Mild bruising Neuro:  Nonfocal  Psych: Normal affect   Labs    Chemistry Recent Labs  Lab 05/05/18 2018 05/06/18 0524 05/07/18 0456  NA 141 139 142  K 4.4 4.4 4.0  CL 113* 113* 115*  CO2 15* 17* 17*  GLUCOSE 276* 234* 154*  BUN 46* 48* 47*  CREATININE 1.91* 1.86* 1.71*  CALCIUM 8.2* 8.2* 8.2*  PROT 5.2*  --   --   ALBUMIN 2.5*  --   --   AST 67*  --   --   ALT 91*  --   --   ALKPHOS 72  --   --   BILITOT 0.7  --   --   GFRNONAA 25* 25* 28*  GFRAA 29* 30* 33*  ANIONGAP '13 9 10     '$ Hematology Recent Labs  Lab 05/05/18 2018 05/06/18 0524 05/07/18 0456  WBC 8.1 7.7  --   RBC 3.49* 3.35* 3.44*  HGB 10.1* 9.7*  --   HCT 32.9* 31.4*  --   MCV 94.3 93.7  --   MCH 28.9 29.0  --   MCHC 30.7 30.9  --  RDW 15.5 15.4  --   PLT 212 217  --     Cardiac Enzymes Recent Labs  Lab 05/05/18 2018 05/06/18 0208  TROPONINI 12.77* 15.07*   No results for input(s): TROPIPOC in the last 168 hours.   BNPNo results for input(s): BNP, PROBNP in the last 168 hours.   DDimer No results for input(s): DDIMER in the last 168 hours.   Radiology    [place] Ekg Site Rite  Result Date: 05/05/2018 If [facility] image not attached, placement could not be confirmed due to current cardiac rhythm.   Cardiac Studies   Echocardiogram at [facility] Technically difficult study Left ventricular wall thickness is normal Overall left ventricular systolic function is moderate-severely impaired with EF 30-35% Left atrium is severely dilated by volume Moderate mitral regurgitation Mild tricuspid regurgitation  Cardiac cath/PCI: Conclusion    Non-ST elevation MI in the setting of DKA induced stress on the substrate of type proximal to mid LAD restenosis due to stent underexpansion from the procedure in 2018.  PCI and restenting reduced to 95% stenosis to less than 10% with TIMI grade III flow using a 3.0 x 12 mm Onyx  and postdilated with a 3.25 x 12 Sapp[place] to 18 atm.  Left main is widely patent.  The LAD contains diffuse disease beyond the first diagonal origin up to 60 to 70%.  The first diagonal is large and the stent in the first diagonal is widely patent.  The circumflex gives origin to 3 obtuse marginal branches.  The mid circumflex contains an eccentric hazy 50% stenosis.  The right coronary contains diffuse 30 to 50% narrowing proximal to distal.  LVEDP 18 mmHg.  RECOMMENDATIONS:   Aspirin and Plavix for at least 12 months.  Aggressive risk factor modification.  Follow kidney function closely.  IV fluid, 50 cc/h for 18 hours.  Watch for any evidence of volume overload.     Patient Profile     78 y.o. female with a hx of CAD s/p NSTEMI with DES to LAD and D1 08/2016, PAF in setting of pneumonia, hyperlipidemia, CVA, type 2 diabetes, CKD stage III who is being seen today for the evaluation of NSTEMI  Assessment & Plan    1. NSTEMI -s/p  non-ST elevation MI in May 2018 and under went stent placement to the proximal LAD and first diagonal -Patient presented to [facility] with complaints of shortness of breath and cough and was admitted for DKA and possible pneumonia. -She denied any chest pain or discomfort at any point during this illness -Troponin rose to 42.60 within 12 hours of admission then down trended to the 20s and then peaked again in the 30s.  EKG shows newly reduced LV function -EKG at [facility] reportedly showed T wave inversions in leads I, aVL and V1.  -Echocardiogram shows newly reduced EF -Cardiac cath  showed severe in stent restenosis in proximal LAD stent which was undersized. S/p re-expansion and stenting with DES.  -Plan DAPT for one year at least.   2. Acute systolic CHF -Newly reduced EF 30-35% -Patient initially did not present with volume overload and was given fluids in the setting of DKA, AKI and possible pneumonia.  Notes from [facility]  indicate that the patient was net positive about 7 L since admission. -Currently she does not look significantly volume overloaded. - EDP at cath was 18 mm Hg.  - not a candidate for ACEi or ARB at this time due to AKI. May consider once renal  function returned to baseline. Continue beta blocker. Hopefully EF will improve with improved myocardial perfusion. - on lasix 20 mg daily  3. CKD stage III -Notes indicate baseline creatinine ~1.2. rose to 1.9. now down to 1.71 -Follow renal function closely  6. Hypertension -Home medications include amlodipine 10 mg, Lasix 20 mg daily, lisinopril 40 mg daily, Lopressor 50 mg twice daily -lisinopril on hold for AKI  6. Hyperlipidemia -On lovastatin 40 mg daily at home.  Last lipid panel in 02/2017 showed LDL of 85.  Currently on pravastatin 40 mg daily goal LDL < 70  7. Diabetes type 2 on insulin -Managed at home with Lantus insulin, glimepiride, pioglitazone -Recent  DKA at [facility].  - per hospitalist team  8. ? Atrial fibrillation -appeared to have some Afib on telemetry on admit but P waves difficult to see. By Ecg was in NSR. NSR on telemetry. Will monitor closely. If she does have Afib will need to consider anticoagulation given high [place] Vas score. Currently she has been in NSR on telemetry. -CHA2DS2/VAS Stroke Risk Score is at least 8(CHF, HTN, age (2), stroke (2), CAD, female).    9. Deconditioning. Will ask PT to see today.      For questions or updates, please contact [place] Please consult [email or web address] for contact info under        Signed, Ark Agrusa Martinique, MD  05/07/2018, 8:53 AM

## 2018-05-08 ENCOUNTER — Inpatient Hospital Stay (HOSPITAL_COMMUNITY): Payer: Medicare Other

## 2018-05-08 DIAGNOSIS — R9389 Abnormal findings on diagnostic imaging of other specified body structures: Secondary | ICD-10-CM

## 2018-05-08 DIAGNOSIS — Z794 Long term (current) use of insulin: Secondary | ICD-10-CM

## 2018-05-08 DIAGNOSIS — N186 End stage renal disease: Secondary | ICD-10-CM

## 2018-05-08 DIAGNOSIS — Z992 Dependence on renal dialysis: Secondary | ICD-10-CM

## 2018-05-08 LAB — GLUCOSE, CAPILLARY
GLUCOSE-CAPILLARY: 159 mg/dL — AB (ref 70–99)
Glucose-Capillary: 161 mg/dL — ABNORMAL HIGH (ref 70–99)
Glucose-Capillary: 168 mg/dL — ABNORMAL HIGH (ref 70–99)
Glucose-Capillary: 164 mg/dL — ABNORMAL HIGH (ref 70–99)

## 2018-05-08 LAB — BASIC METABOLIC PANEL
Anion gap: 9 (ref 5–15)
BUN: 46 mg/dL — ABNORMAL HIGH (ref 8–23)
CO2: 19 mmol/L — ABNORMAL LOW (ref 22–32)
Calcium: 7.9 mg/dL — ABNORMAL LOW (ref 8.9–10.3)
Chloride: 111 mmol/L (ref 98–111)
Creatinine, Ser: 1.7 mg/dL — ABNORMAL HIGH (ref 0.44–1.00)
GFR calc Af Amer: 33 mL/min — ABNORMAL LOW (ref >60)
GFR calc non Af Amer: 28 mL/min — ABNORMAL LOW (ref >60)
Glucose, Bld: 184 mg/dL — ABNORMAL HIGH (ref 70–99)
Potassium: 3.8 mmol/L (ref 3.5–5.1)
Sodium: 139 mmol/L (ref 135–145)

## 2018-05-08 LAB — CBC
HCT: 30.8 % — ABNORMAL LOW (ref 36.0–46.0)
Hemoglobin: 9.8 g/dL — ABNORMAL LOW (ref 12.0–15.0)
MCH: 29.2 pg (ref 26.0–34.0)
MCHC: 31.8 g/dL (ref 30.0–36.0)
MCV: 91.7 fL (ref 80.0–100.0)
Platelets: 240 10*3/uL (ref 150–400)
RBC: 3.36 MIL/uL — ABNORMAL LOW (ref 3.87–5.11)
RDW: 14.9 % (ref 11.5–15.5)
WBC: 7.8 10*3/uL (ref 4.0–10.5)
nRBC: 0.4 % — ABNORMAL HIGH (ref 0.0–0.2)

## 2018-05-08 LAB — HEPATIC FUNCTION PANEL
ALK PHOS: 63 U/L (ref 38–126)
ALT: 49 U/L — ABNORMAL HIGH (ref 0–44)
AST: 19 U/L (ref 15–41)
Albumin: 2.3 g/dL — ABNORMAL LOW (ref 3.5–5.0)
Bilirubin, Direct: <0.1 mg/dL (ref 0.0–0.2)
Total Bilirubin: 0.7 mg/dL (ref 0.3–1.2)
Total Protein: 4.9 g/dL — ABNORMAL LOW (ref 6.5–8.1)

## 2018-05-08 MED ORDER — METOPROLOL SUCCINATE ER 50 MG PO TB24
100.0000 mg | ORAL_TABLET | Freq: Every day | ORAL | Status: DC
  Administered 2018-05-08 – 2018-05-09 (×2): 100 mg via ORAL
  Filled 2018-05-08 (×2): qty 2

## 2018-05-08 MED ORDER — HYDRALAZINE HCL 25 MG PO TABS: 25.0000 mg | ORAL_TABLET | Freq: Three times a day (TID) | ORAL | Status: DC

## 2018-05-08 MED ORDER — CEFDINIR 300 MG PO CAPS
300.0000 mg | ORAL_CAPSULE | Freq: Two times a day (BID) | ORAL | Status: DC
  Administered 2018-05-08 – 2018-05-09 (×2): 300 mg via ORAL
  Filled 2018-05-08 (×3): qty 1

## 2018-05-08 MED ORDER — LIVING BETTER WITH HEART FAILURE BOOK: Freq: Once | Status: DC

## 2018-05-08 MED ORDER — DOXYCYCLINE HYCLATE 100 MG PO TABS
100.0000 mg | ORAL_TABLET | Freq: Two times a day (BID) | ORAL | Status: DC
  Administered 2018-05-08 – 2018-05-09 (×2): 100 mg via ORAL
  Filled 2018-05-08 (×3): qty 1

## 2018-05-08 MED ORDER — FUROSEMIDE 40 MG PO TABS
40.0000 mg | ORAL_TABLET | Freq: Every day | ORAL | Status: DC
  Administered 2018-05-08 – 2018-05-09 (×2): 40 mg via ORAL
  Filled 2018-05-08 (×2): qty 1

## 2018-05-08 MED ORDER — ATORVASTATIN CALCIUM 40 MG PO TABS
40.0000 mg | ORAL_TABLET | Freq: Every day | ORAL | Status: DC
  Administered 2018-05-08: 19:00:00 40 mg via ORAL
  Filled 2018-05-08: qty 1

## 2018-05-08 MED ORDER — HYDRALAZINE HCL 50 MG PO TABS: 50.0000 mg | ORAL_TABLET | Freq: Three times a day (TID) | ORAL | Status: DC

## 2018-05-08 MED ORDER — HYDRALAZINE HCL 25 MG PO TABS
25.0000 mg | ORAL_TABLET | Freq: Three times a day (TID) | ORAL | Status: DC
  Administered 2018-05-08 – 2018-05-09 (×4): 25 mg via ORAL
  Filled 2018-05-08 (×4): qty 1

## 2018-05-08 NOTE — Care Management Important Message (Signed)
Important Message  Patient Details  Name: Ashley Fuller MRN: [id number] Date of Birth: 12/28/1940   Medicare Important Message Given:  Yes    Juline Sanderford P Morley 05/08/2018, 11:14 AM

## 2018-05-08 NOTE — Care Management Note (Signed)
Case Management Note  Patient Details  Name: Ashley Fuller MRN: [id number] Date of Birth: 02-09-1941  Subjective/Objective:  NCM notified by RN Clair Gulling that patient is refusing to go to SNF,  NCM spoke with patient she states she lives with her son and his wife and someone is with her at all times, she does not want to go to SNF.  NCM asked if she would like HHPT, she states yes, NCM gave her the Medicare. gov list of agencies, she states one of her family members work for [employer] and she would like to use them.  NCM made referral to [facility] for [place]. Will need HHPT order with [id number] prior to dc.  Per RN Clair Gulling, states patient's POA says she can not come home because there is no one there to help her since she is getting chemo. NCM informed Lorriane Shire CSW , she will come to speak with patient. Per CSW patient has agreed to go to SNF . Plan is to dc to SNF                   Action/Plan: DC to SNF when ready.   Expected Discharge Date:                  Expected Discharge Plan:  Skilled Nursing Facility  In-House Referral:  Clinical Social Work  Discharge planning Services  CM Consult  Post Acute Care Choice:    Choice offered to:     DME Arranged:    DME Agency:     HH Arranged:    [place] Agency:     Status of Service:  Completed, signed off  If discussed at [employer], dates discussed:    Additional Comments:  Zenon Mayo, RN 05/08/2018, 3:26 PM

## 2018-05-08 NOTE — Progress Notes (Addendum)
Progress Note  Patient Name: Ashley Fuller Date of Encounter: 05/08/2018  Primary Cardiologist: Dr. Otho Perl, [facility] in [place] feeling SOB, similar to the last few days. This gets worse when she lies back. No CP. Seems to have limited understanding of everything that's happened this admission.  Inpatient Medications    Scheduled Meds: . amLODipine  10 mg Oral Daily  . aspirin EC  81 mg Oral Daily  . clopidogrel  75 mg Oral Q breakfast  . furosemide  20 mg Oral Daily  . heparin  5,000 Units Subcutaneous Q8H  . insulin aspart  0-15 Units Subcutaneous TID WC  . insulin glargine  8 Units Subcutaneous QHS  . mouth rinse  15 mL Mouth Rinse BID  . metoprolol tartrate  50 mg Oral BID  . pravastatin  40 mg Oral q1800  . sodium chloride flush  10-40 mL Intracatheter Q12H  . sodium chloride flush  3 mL Intravenous Q12H   Continuous Infusions: . sodium chloride     PRN Meds: sodium chloride, acetaminophen, nitroGLYCERIN, ondansetron **OR** ondansetron (ZOFRAN) IV, oxyCODONE, sodium chloride flush, sodium chloride flush, traMADol   Vital Signs    Vitals:   05/07/18 2127 05/08/18 0200 05/08/18 0400 05/08/18 0446  BP: 125/70   127/64  Pulse: 77 66 65 67  Resp: (!) 27 (!) 28 (!) 21 (!) 24  Temp:    97.7 F (36.5 C)  TempSrc:    Oral  SpO2: 91% 97% 96% 96%  Weight:   91.9 kg   Height:        Intake/Output Summary (Last 24 hours) at 05/08/2018 0758 Last data filed at 05/08/2018 7510 Gross per 24 hour  Intake 360 ml  Output 1400 ml  Net -1040 ml   Last 3 Weights 05/08/2018 05/07/2018 05/06/2018  Weight (lbs) 202 lb 9.6 oz 209 lb 7 oz 208 lb 8 oz  Weight (kg) 91.9 kg 95 kg 94.575 kg     Telemetry    NSR brief run NSVT - Personally Reviewed  Physical Exam   GEN: No acute distress, obese HEENT: Normocephalic, atraumatic, sclera non-icteric. Neck: No JVD or bruits. Cardiac: RRR, no murmurs, rubs, or gallops.  Radials/DP/PT 1+ and equal  bilaterally.  Respiratory: Diminished BS at bases - coarse. No wheezing or rhonchi. Breathing is unlabored. GI: Soft, nontender, non-distended, BS +x 4. MS: no deformity. Extremities: No clubbing or cyanosis. No edema. Distal pedal pulses are 2+ and equal bilaterally. L radial ecchymosis without firm hematoma. No vascular compromise. Neuro:  AAOx3. Follows commands. Psych:  Responds to questions appropriately with a normal affect.  Labs    Chemistry Recent Labs  Lab 05/05/18 2018 05/06/18 0524 05/07/18 0456 05/08/18 0505  NA 141 139 142 139  K 4.4 4.4 4.0 3.8  CL 113* 113* 115* 111  CO2 15* 17* 17* 19*  GLUCOSE 276* 234* 154* 184*  BUN 46* 48* 47* 46*  CREATININE 1.91* 1.86* 1.71* 1.70*  CALCIUM 8.2* 8.2* 8.2* 7.9*  PROT 5.2*  --   --   --   ALBUMIN 2.5*  --   --   --   AST 67*  --   --   --   ALT 91*  --   --   --   ALKPHOS 72  --   --   --   BILITOT 0.7  --   --   --   GFRNONAA 25* 25* 28* 28*  GFRAA 29* 30* 33*  33*  ANIONGAP 13 9 10 9      Hematology Recent Labs  Lab 05/05/18 2018 05/06/18 0524 05/07/18 0456 05/08/18 0505  WBC 8.1 7.7  --  7.8  RBC 3.49* 3.35* 3.44* 3.36*  HGB 10.1* 9.7*  --  9.8*  HCT 32.9* 31.4*  --  30.8*  MCV 94.3 93.7  --  91.7  MCH 28.9 29.0  --  29.2  MCHC 30.7 30.9  --  31.8  RDW 15.5 15.4  --  14.9  PLT 212 217  --  240    Cardiac Enzymes Recent Labs  Lab 05/05/18 2018 05/06/18 0208  TROPONINI 12.77* 15.07*   No results for input(s): TROPIPOC in the last 168 hours.    Radiology    No results found.  Patient Profile     78 y.o. female with CAD s/p NSTEMI with DES to LAD and D1 08/2016, ICM EF 40-45% in 02/2017, PAF in setting of pneumonia, hyperlipidemia, CVA, type 2 diabetes, CKD stage III who presented initially to [facility] with PNA, acute respiratory failure and DKA. She ruled in for NSTEMI with troponin of 42. 2D Echo @ [facility] reported to show LVEF 30-35%, severely dilated LA, moderate MR and mild TR. Other ongoing  issues this admission include AKI on CKD (prior baseline appears 1.2-1.4 but minimal labs available), and anemia. Went to cath lab 05/06/18 - severe in stent restenosis in proximal LAD stent which was undersized - s/p re-expansion and stenting with DES.   Assessment & Plan    1. NSTEMI/CAD - s/p PCI this admission as above. Changed to Plavix - plan DAPT for at least 1 yr.   2.Acute on chronic systolic CHF - LVEF in 41/9622 by outside records was 40-45% so this represents continued downtrend. LVEDP 18 by cath. Given rise and plateau in Cr, would not pursue ACEI/ARB/spiro/ARNI at this time but can be considered as OP. Will switch amlodipine to hydralazine for afterload reduction. Could consider addition of low dose Imdur as well if BP tolerates. Consolidate Lopressor to Toprol given LV dysfunction. Hopefully EF will improve with improved myocardial perfusion. Will rx HF booklet. Reviewed 2g sodium restriction, 2L fluid restriction, daily weights with patient. Consult dietitian for CHF education. Add low sodium modifier to diet. She does report continued dyspnea this morning with orthopnea. Will obtain 2V CXR and discuss diuretic with Dr. Martinique. Hold oral Lasix pending this discussion (not yet due).  3.CKD stage III - previously 1.2-1.4 in CareEverywhere, plateaued at 1.7. Continue to monitor.  4.Hypertension - follow with above med changes.  5.Hyperlipidemia - given CAD will switch lovastatin to atorvastatin. If the patient is tolerating statin at time of follow-up appointment, would consider rechecking liver function/lipid panel in 6-8 weeks. Add F/u LFTs to labs this AM as there was elevation on admission (? Sequelae of MI).   6.Diabetes type 2 on insulin - pharmD has spoken with IM given that patient is no longer candidate for Actos given LV dysfunction and metformin given Cr. Internal medicine to manage.  7. ? Paroxysmal atrial fibrillation - per report, appeared to have some Afib on  telemetry on admit but P waves were difficult to see. By Ecg was in NSR. She has been maintaining NSR on telemetry. If she does have recurrent Afib will need to consider anticoagulation given high [place] Vas score of 8. Outpatient 30 day monitor could be considered to screen for occult AF. Given severe LAE she is at risk of development of this.  8. Anemia -  lower than 2018 but relatively stable this admission. Per primary team.  9. Deconditioning. PT ordered.  For questions or updates, please contact [place] Please consult [email or web address] for contact info under Cardiology/STEMI.  Signed, Charlie Pitter, PA-C 05/08/2018, 7:58 AM    Patient seen and examined and history reviewed. Agree with above findings and plan. Looks about the same this am. No chest pain. Some intermittent SOB. Agree with recommendations above. Will switch amlodipine to hydralazine. Will resume lasix 40 mg po daily. Hold off on ACEi/ARB until renal function back to baseline. Awaiting PT evaluation. She is OK for DC from our standpoint once ambulatory.   [facility] will sign off.   Medication Recommendations:  As per [facility] Other recommendations (labs, testing, etc):  none Follow up as an outpatient:  As per Dayna Dunn's note 05/08/18 at 9:38.    Martinique, Lares 05/08/2018 9:24 AM

## 2018-05-08 NOTE — Clinical Social Work Placement (Signed)
   CLINICAL SOCIAL WORK PLACEMENT  NOTE *05/08/17 - [facility] chosen - see comments section below.  Date:  05/08/2018  Patient Details  Name: Ashley Fuller MRN: [id number] Date of Birth: December 05, 1940  Clinical Social Work is seeking post-discharge placement for this patient at the [place] level of care (*CSW will initial, date and re-position this form in  chart as items are completed):  Yes   Patient/family provided with [place] Work Department's list of facilities offering this level of care within the geographic area requested by the patient (or if unable, by the patient's family).  Yes   Patient/family informed of their freedom to choose among providers that offer the needed level of care, that participate in Medicare, Medicaid or managed care program needed by the patient, have an available bed and are willing to accept the patient.  Yes   Patient/family informed of [facility]'s ownership interest in [facility] and [facility], as well as of the fact that they are under no obligation to receive care at these facilities.  PASRR submitted to EDS on       PASRR number received on       Existing PASRR number confirmed on 05/08/18     FL2 transmitted to all facilities in geographic area requested by pt/family on 05/08/18     FL2 transmitted to all facilities within larger geographic area on       Patient informed that his/her managed care company has contracts with or will negotiate with certain facilities, including the following:         05/08/18 - Patient/family informed of bed offers received.  Patient chooses bed at  [facility] skilled nursing facility.     Physician recommends and patient chooses bed at      Patient to be transferred to   on  .  Patient to be transferred to facility by       Patient family notified on   of transfer.  Name of family member notified:        PHYSICIAN      Additional  Comment:  05/08/17 - CSW contacted [facility] (4:39 pm) and left message for admissions director that family wants bed.     _______________________________________________ Sable Feil, LCSW 05/08/2018, 3:26 PM

## 2018-05-08 NOTE — Progress Notes (Addendum)
Patient is not sure where she wants to follow up, states "whatever works."  She previously saw both Dr. Larina Bras and Dr. Otho Perl. I am worried if we do not establish a formal follow-up plan that she will fall through the cracks.  Have tentatively arranged 8 day TOC f/u 05/16/18 with Dr. Geraldo Pitter in our [place] office and added to AVS. As she will likely be going to SNF per PT rec, did not send message to our office for [facility] call - but did ensure close f/u. I wrote on AVS that if she and her family decide instead to f/u with Dr. Otho Perl that they should do so within 1 week and call us to let us know.  Dayna Dunn PA-C

## 2018-05-08 NOTE — Clinical Social Work Note (Signed)
Clinical Social Work Assessment  Patient Details  Name: Ashley Fuller MRN: [id number] Date of Birth: December 11, 1940  Date of referral:  05/08/18               Reason for consult:  Facility Placement, Discharge Planning                Permission sought to share information with:  Family Supports Permission granted to share information::  Yes, Verbal Permission Granted  Name::     Dellis Filbert and Katherine Basset  Agency::     Relationship::  Son and daughter-in-law  Sport and exercise psychologist Information:  Cell - [phone]; Home - [phone]  Housing/Transportation Living arrangements for the past 2 months:  Single Family Home Source of Information:  Patient, Other (Comment Required)(Daughter-in-law Maudie Mercury) Patient Interpreter Needed:  None Criminal Activity/Legal Involvement Pertinent to Current Situation/Hospitalization:  No - Comment as needed Significant Relationships:  Adult Children, Other Family Members Lives with:  Adult Children(Son and daughter-in-law) Do you feel safe going back to the place where you live?  Yes(Patient initially expressed desire to return home, but eventually indicated agreement with ST rehab) Need for family participation in patient care:  Yes (Comment)  Care giving concerns:  Patient initially indicated to nursing staff that she wanted to discharge home, but after talking with patient she expressed agreement with ST rehab. Patient's daughter-in-law and POA indicated that she is currently unable to care for patient as she is receiving chemotherapy treatments.  Social Worker assessment / plan:  CSW talked with Mrs. Walko at the bedside regarding her discharge disposition and the recommendation of ST rehab. Mrs. Funchess was sitting up in a chair eating lunch and was alert, oriented and willing to talk with CSW. Patient initially advised CSW that she wants to go home and when asked who would assist her at home, she replied that one of her grandchildren could help her and this was  discussed. Patient advised of concerns of daughter-in-law that patient would not have the care she needs at home due to her health issues and this was discussed. Patient later expressed that she will do whatever her son and daughter-in-law want her to do.  CSW was given permission to contact her son or daughter-in-law and was informed that Maudie Mercury was her POA. When asked, patient reported that she has a daughter who lives 4 to 5 miles from her, on the outskirts of [place], [place].   Call made to family and spoke with Maudie Mercury and she expressed agreement that patient needs rehab for weakness and that she is currently getting chemo and is physically unable to provide the care her mother-in-law needs. When asked, daughter reported that they live approx. 30 minutes from [place]. CSW provided with 3 facility choices: [place], [place], and [place].   Employment status:  Retired Forensic scientist:  Medicare PT Recommendations:  [place] / Referral to community resources:  [place] [facility](SNF list provided to patient)  Patient/Family's Response to care:  Patient and daughter-in-law expressed no concerns regarding her care during hospitalization.  Patient/Family's Understanding of and Emotional Response to Diagnosis, Current Treatment, and Prognosis: Patient initially expressed wanting to go home, but later agreed to SNF as this is what her family feels is best for her at this time.   Emotional Assessment Appearance:  Appears stated age Attitude/Demeanor/Rapport:  Other, Engaged(Appropriate) Affect (typically observed):  Pleasant Orientation:  Oriented to Self, Oriented to Place, Oriented to  Time,  Oriented to Situation Alcohol / Substance use:  Tobacco Use, Alcohol Use, Illicit Drugs(Patient reported that she never smoked and does not drink or use illicit drugs) Psych involvement (Current and /or in the community):  No  (Comment)  Discharge Needs  Concerns to be addressed:  Discharge Planning Concerns Readmission within the last 30 days:  No Current discharge risk:  None Barriers to Discharge:  Continued Medical Work up   [employer] Mila Homer, Leonard 05/08/2018, 3:07 PM

## 2018-05-08 NOTE — NC FL2 (Signed)
[place] MEDICAID FL2 LEVEL OF CARE SCREENING TOOL     IDENTIFICATION  Patient Name: Ashley Fuller Birthdate: 1940/11/06 Sex: female Admission Date (Current Location): 05/05/2018  [place] and [place] Number:  [place](Patient lives in [place]. She is in [facility] )   Facility and Address:  [facility] [place], [place] [address] [place], [place] [address]      Provider Number: [id number]  Attending Physician Name and Address:  [place] Shi, MD  Relative Name and Phone Number:  Dellis Filbert and Abbygale Lapid, son and daughter-in-law; Merry Proud cell [phone], home [phone]    Current Level of Care: Hospital Recommended Level of Care: [place] Prior Approval Number:    Date Approved/Denied:   PASRR Number: [id number](Eff. 02/06/08)  Discharge Plan: SNF    Current Diagnoses: Patient Active Problem List   Diagnosis Date Noted  . NSTEMI (non-ST elevated myocardial infarction) ([place]) 05/05/2018  . CAD (coronary artery disease) 05/05/2018  . Controlled type 2 diabetes mellitus with chronic kidney disease on chronic dialysis, with long-term current use of insulin ([place]) 05/05/2018  . Hyperlipidemia LDL goal <70 05/05/2018  . Hypertension 05/05/2018  . CKD stage 3 due to type 2 diabetes mellitus ([place]) 05/05/2018    Orientation RESPIRATION BLADDER Height & Weight     Self, Time, Situation, Place  Normal External catheter(placed 05/06/18) Weight: 202 lb 9.6 oz (91.9 kg) Height:  5\' 4"  (162.6 cm)  BEHAVIORAL SYMPTOMS/MOOD NEUROLOGICAL BOWEL NUTRITION STATUS      Continent Diet(Carb modified, thin liquids)  AMBULATORY STATUS COMMUNICATION OF NEEDS Skin   Limited Assist(Min Guard per PT) Verbally Other (Comment)(Ecchymosis right/left arm)                       Personal Care Assistance Level of Assistance  Bathing, Feeding, Dressing Bathing Assistance: Limited assistance Feeding assistance:  Independent Dressing Assistance: Limited assistance     Functional Limitations Info  Sight, Hearing, Speech Sight Info: Impaired(Wears glasses) Hearing Info: Adequate Speech Info: Adequate    SPECIAL CARE FACTORS FREQUENCY  PT (By licensed PT)     PT Frequency: Evaluated 1/23 at hospital. PT at SNF, eval and treat              Contractures Contractures Info: Not present    Additional Factors Info  Code Status, Allergies Code Status Info: Full Allergies Info: Codeine, Penicillins           Current Medications (05/08/2018):  This is the current hospital active medication list Current Facility-Administered Medications  Medication Dose Route Frequency Provider Last Rate Last Dose  . 0.9 %  sodium chloride infusion  250 mL Intravenous PRN Matcha, Anupama, MD      . acetaminophen (TYLENOL) tablet 650 mg  650 mg Oral Q4H PRN Cherene Altes, MD      . aspirin EC tablet 81 mg  81 mg Oral Daily Belva Crome, MD   81 mg at 05/08/18 1148  . atorvastatin (LIPITOR) tablet 40 mg  40 mg Oral q1800 Dunn, Dayna N, PA-C      . clopidogrel (PLAVIX) tablet 75 mg  75 mg Oral Q breakfast Belva Crome, MD   75 mg at 05/08/18 1148  . furosemide (LASIX) tablet 40 mg  40 mg Oral Daily Martinique, Peter M, MD   40 mg at 05/08/18 1148  . heparin injection 5,000 Units  5,000 Units Subcutaneous Q8H Belva Crome, MD   5,000 Units  at 05/08/18 1428  . hydrALAZINE (APRESOLINE) tablet 25 mg  25 mg Oral Q8H [place] Shi, MD   25 mg at 05/08/18 1153  . insulin aspart (novoLOG) injection 0-15 Units  0-15 Units Subcutaneous TID WC Belva Crome, MD   3 Units at 05/08/18 1430  . insulin glargine (LANTUS) injection 8 Units  8 Units Subcutaneous QHS Cherene Altes, MD   8 Units at 05/07/18 2153  . Living Better with Heart Failure Book   Does not apply Once Dunn, Dayna N, PA-C      . MEDLINE mouth rinse  15 mL Mouth Rinse BID Belva Crome, MD   15 mL at 05/08/18 1431  . metoprolol succinate  (TOPROL-XL) 24 hr tablet 100 mg  100 mg Oral Daily Dunn, Dayna N, PA-C   100 mg at 05/08/18 1149  . nitroGLYCERIN (NITROSTAT) SL tablet 0.4 mg  0.4 mg Sublingual Q5 Min x 3 PRN Belva Crome, MD      . ondansetron [facility]) tablet 4 mg  4 mg Oral Q6H PRN Belva Crome, MD       Or  . ondansetron [facility]) injection 4 mg  4 mg Intravenous Q6H PRN Belva Crome, MD   4 mg at 05/07/18 1318  . oxyCODONE (Oxy IR/ROXICODONE) immediate release tablet 5-10 mg  5-10 mg Oral Q4H PRN Belva Crome, MD      . sodium chloride flush (NS) 0.9 % injection 10-40 mL  10-40 mL Intracatheter Q12H Belva Crome, MD   10 mL at 05/07/18 2126  . sodium chloride flush (NS) 0.9 % injection 10-40 mL  10-40 mL Intracatheter PRN Belva Crome, MD   30 mL at 05/08/18 0507  . sodium chloride flush (NS) 0.9 % injection 3 mL  3 mL Intravenous Q12H Belva Crome, MD   3 mL at 05/08/18 1432  . sodium chloride flush (NS) 0.9 % injection 3 mL  3 mL Intravenous PRN Belva Crome, MD      . traMADol Veatrice Bourbon) tablet 50 mg  50 mg Oral Q6H PRN Belva Crome, MD         Discharge Medications: Please see discharge summary for a list of discharge medications.  Relevant Imaging Results:  Relevant Lab Results:   Additional Information [phone].  Sable Feil, LCSW

## 2018-05-08 NOTE — Discharge Instructions (Addendum)
Information about your medication: Plavix (anti-platelet agent) ° °Generic Name (Brand): clopidogrel (Plavix), once daily medication ° °PURPOSE: You are taking this medication along with aspirin to lower your chance of having a heart attack, stroke, or blood clots in your heart stent. These can be fatal. Brilinta and aspirin help prevent platelets from sticking together and forming a clot that can block an artery or your stent.  ° °Common SIDE EFFECTS you may experience include: bruising or bleeding more easily, shortness of breath ° °Do not stop taking PLAVIX without talking to the doctor who prescribes it for you. People who are treated with a stent and stop taking Plavix too soon, have a higher risk of getting a blood clot in the stent, having a heart attack, or dying. If you stop Plavix because of bleeding, or for other reasons, your risk of a heart attack or stroke may increase.  ° °Tell all of your doctors and dentists that you are taking Plavix. They should talk to the doctor who prescribed plavix for you before you have any surgery or invasive procedure.  ° °Contact your health care provider if you experience: severe or uncontrollable bleeding, pink/red/brown urine, vomiting blood or vomit that looks like "coffee grounds", red or black stools (looks like tar), coughing up blood or blood clots °---------------------------------------------------------------------------------------------------------------------- ° °

## 2018-05-08 NOTE — Evaluation (Signed)
Physical Therapy Evaluation Patient Details Name: Ashley Fuller MRN: [id number] DOB: 05-19-1940 Today's Date: 05/08/2018   History of Present Illness  Pt is a 78 y.o. female admitted 1/20//20 with acute respiratory failure, DKA and NSTEMI. Cardiac cath s/p PCI 1/21. PMH includes CKD 3, CHF, HTN, HLD, DM2.    Clinical Impression  Pt presents with an overall decrease in functional mobility secondary to above. PTA, pt primarily sedentary; able to amb short distances with RW, uses w/c PRN; family assists with ADLs. Today, pt able to amb 10' with RW and min guard, requiring assist to prevent LOB. Discussed recommendation for SNF-level therapies; pt prefers to return home with continued assist from family. Pt limited by generalized weakness and decreased activity tolerance; at high risk for falls. Will follow acutely to address established goals.     Follow Up Recommendations SNF;Supervision for mobility/OOB    Equipment Recommendations  None recommended by PT    Recommendations for Other Services       Precautions / Restrictions Precautions Precautions: Fall Restrictions Weight Bearing Restrictions: No      Mobility  Bed Mobility Overal bed mobility: Needs Assistance Bed Mobility: Supine to Sit;Sit to Supine     Supine to sit: Supervision;HOB elevated Sit to supine: Mod assist   General bed mobility comments: Increased time and effort coming to EOB, pt asking for assist but able to pull hips to EOB using bed rail. ModA to assist BLEs to supine  Transfers Overall transfer level: Needs assistance Equipment used: Rolling walker (2 wheeled) Transfers: Sit to/from Stand Sit to Stand: Min guard         General transfer comment: Min guard for balance  Ambulation/Gait Ambulation/Gait assistance: Min guard;Min assist Gait Distance (Feet): 10 Feet Assistive device: Rolling walker (2 wheeled) Gait Pattern/deviations: Step-to pattern;Trunk flexed;Wide base of support Gait  velocity: Decreased Gait velocity interpretation: <1.31 ft/sec, indicative of household ambulator General Gait Details: Slow, labored gait in room with RW and close min guard for balance; 1x posterior LOB requiring minA to correct. DOE 3/4, SpO2 93% on RA. Further distance limited by fatigue  Stairs            Wheelchair Mobility    Modified Rankin (Stroke Patients Only)       Balance Overall balance assessment: Needs assistance   Sitting balance-Leahy Scale: Fair       Standing balance-Leahy Scale: Poor Standing balance comment: Reliant on UE support                             Pertinent Vitals/Pain Pain Assessment: Faces Faces Pain Scale: Hurts a little bit Pain Location: Head Pain Descriptors / Indicators: Headache Pain Intervention(s): Monitored during session    Home Living Family/patient expects to be discharged to:: Private residence Living Arrangements: Children;Other relatives Available Help at Discharge: Family;Available 24 hours/day Type of Home: House Home Access: Ramped entrance     Home Layout: One level Home Equipment: Walker - 2 wheels;Shower seat;Wheelchair - manual      Prior Function Level of Independence: Needs assistance   Gait / Transfers Assistance Needed: Sounds fairly sedentary at home. Household ambulation with RW and assist from family as needed  ADL's / Homemaking Assistance Needed: Family assists PRN; daughter always assists with bathing        Hand Dominance        Extremity/Trunk Assessment   Upper Extremity Assessment Upper Extremity Assessment: Generalized weakness  Lower Extremity Assessment Lower Extremity Assessment: Generalized weakness    Cervical / Trunk Assessment Cervical / Trunk Assessment: Kyphotic  Communication   Communication: No difficulties  Cognition Arousal/Alertness: Awake/alert Behavior During Therapy: Flat affect Overall Cognitive Status: No family/caregiver present to  determine baseline cognitive functioning Area of Impairment: Following commands;Safety/judgement;Awareness;Problem solving                       Following Commands: Follows one step commands with increased time Safety/Judgement: Decreased awareness of safety;Decreased awareness of deficits Awareness: Emergent Problem Solving: Slow processing General Comments: Generally slow moving, slow to answer questions. Knew she was at [facility] but unable to state current hospital, said she's in [place] comments (skin integrity, edema, etc.): "I'm weak and I'm lazy"    Exercises     Assessment/Plan    PT Assessment Patient needs continued PT services  PT Problem List Decreased strength;Decreased activity tolerance;Decreased balance;Decreased mobility;Decreased cognition;Decreased knowledge of use of DME;Cardiopulmonary status limiting activity       PT Treatment Interventions DME instruction;Gait training;Stair training;Functional mobility training;Therapeutic activities;Therapeutic exercise;Balance training;Patient/family education;Wheelchair mobility training    PT Goals (Current goals can be found in the Care Plan section)  Acute Rehab PT Goals Patient Stated Goal: Return home with continued assist from family PT Goal Formulation: With patient Time For Goal Achievement: 05/22/18 Potential to Achieve Goals: Good    Frequency Min 3X/week   Barriers to discharge        Co-evaluation               AM-PAC PT "6 Clicks" Mobility  Outcome Measure Help needed turning from your back to your side while in a flat bed without using bedrails?: A Little Help needed moving from lying on your back to sitting on the side of a flat bed without using bedrails?: A Little Help needed moving to and from a bed to a chair (including a wheelchair)?: A Little Help needed standing up from a chair using your arms (e.g., wheelchair or bedside chair)?: A  Little Help needed to walk in hospital room?: A Little Help needed climbing 3-5 steps with a railing? : A Lot 6 Click Score: 17    End of Session Equipment Utilized During Treatment: Gait belt Activity Tolerance: Patient limited by fatigue Patient left: in bed;with call bell/phone within reach;with bed alarm set Nurse Communication: Mobility status PT Visit Diagnosis: Other abnormalities of gait and mobility (R26.89);Muscle weakness (generalized) (M62.81)    Time: 4696-2952 PT Time Calculation (min) (ACUTE ONLY): 24 min   Charges:   PT Evaluation $PT Eval Moderate Complexity: 1 Mod PT Treatments $Gait Training: 8-22 mins      Mabeline Caras, PT, DPT Acute Rehabilitation Services  Pager [phone] Office [phone]  Derry Lory 05/08/2018, 9:14 AM

## 2018-05-08 NOTE — Plan of Care (Signed)
Nutrition Education Note  RD consulted for nutrition education regarding CHF.  Spoke with pt at bedside who reports that she lives with her son and daughter-in-law and eats the food that they prepare for her. Pt endorses eating 3 meals daily. Pt states that she does not add salt to her food when she eats but believes that her whoever cooks her meal (son or daughter-in-law) may add salt when cooking.  Breakfast: scrambled eggs and coffee Lunch: chicken pot pie Dinner: similar to lunch  Pt states that she does not eat frozen meals and tries to eat more fresh foods.  Pt shares that she does have a scale at home and weighs herself "some." RD encouraged pt to weigh herself daily. Pt states that she is in the process of finding a PCP.  RD provided "Low Sodium Nutrition Therapy" handout from the Academy of Nutrition and Dietetics. Reviewed patient's dietary recall. Provided examples on ways to decrease sodium intake in diet. Discouraged intake of processed foods and use of salt shaker. Encouraged fresh fruits and vegetables as well as whole grain sources of carbohydrates to maximize fiber intake.   RD discussed why it is important for patient to adhere to diet recommendations, and emphasized the role of fluids, foods to avoid, and importance of weighing self daily. Teach back method used.  Expect good compliance.  Body mass index is 34.78 kg/m. Pt meets criteria for obesity class I based on current BMI.  Current diet order is Carb Modified with 1800 ml fluid restriction. Diet just advanced this morning. Labs and medications reviewed. No further nutrition interventions warranted at this time. RD contact information provided. If additional nutrition issues arise, please re-consult RD.    Gaynell Face, MS, RD, LDN Inpatient Clinical Dietitian Pager: [phone] Weekend/After Hours: [phone]

## 2018-05-08 NOTE — Care Management Note (Addendum)
Case Management Note  Patient Details  Name: Ashley Fuller MRN: [id number] Date of Birth: 1941/03/14  Subjective/Objective:    NCM notified by RN Clair Gulling that patient is refusing to go to SNF,  NCM spoke with patient she states she lives with her son and his wife and someone is with her at all times, she does not want to go to SNF.  NCM asked if she would like HHPT, she states yes, NCM gave her the Medicare. gov list of agencies, she states one of her family members work for [employer] and she would like to use them.  NCM made referral to [place] for [place]. Will need HHPT order with [id number] prior to dc.  Per RN Clair Gulling, states patient's POA says she can not come home because there is no one there to help her since she is getting chemo. NCM informed Lorriane Shire CSW , she will come to speak with patient. Per CSW patient has agreed to go to SNF .               Action/Plan: Will need HHPT order prior to dc.   Expected Discharge Date:                  Expected Discharge Plan:  Skilled Nursing Facility  In-House Referral:  Clinical Social Work  Discharge planning Services  CM Consult  Post Acute Care Choice:    Choice offered to:  Patient  DME Arranged:    DME Agency:     HH Arranged:  PT [place] Agency:  Other - See comment  Status of Service:  Completed, signed off  If discussed at [place] of Stay Meetings, dates discussed:    Additional Comments:  Zenon Mayo, RN 05/08/2018, 10:14 AM

## 2018-05-08 NOTE — Progress Notes (Signed)
PROGRESS NOTE    Ashley Fuller  YWV:[id number]  DOB: 04-Feb-1941  DOA: 05/05/2018 PCP: Raina Mina., MD  Brief Narrative:   78 year old female with history of CAD status post PTCA, chronic kidney disease stage III, diabetes, hyperlipidemia and hypertension presented to [facility] with pneumonia, lactic acidosis and DKA.  She also had non-STEMI with troponin peaking to 40.  She was transferred to [facility] health on January 20 and underwent cardiac cath, PCI proximal to mid LAD due to severe ISR in an angiographically/fluoroscopically underexpanded LAD stent > successful PTCA and restenting of the proximal to mid LAD on January 21.  Subjective:  Patient appears comfortable talking to dietitian when seen in rounds this morning.  Denies any chest pain.  Saturating well on room air.  Objective: Vitals:   05/08/18 0400 05/08/18 0446 05/08/18 1042 05/08/18 1639  BP:  127/64 140/77 121/61  Pulse: 65 67 69 75  Resp: (!) 21 (!) 24 (!) 30 (!) 26  Temp:  97.7 F (36.5 C) (!) 97.5 F (36.4 C) 98.1 F (36.7 C)  TempSrc:  Oral Oral Oral  SpO2: 96% 96% 94% 97%  Weight: 91.9 kg     Height:        Intake/Output Summary (Last 24 hours) at 05/08/2018 1927 Last data filed at 05/08/2018 1700 Gross per 24 hour  Intake 240 ml  Output 1025 ml  Net -785 ml   Filed Weights   05/06/18 0103 05/07/18 0651 05/08/18 0400  Weight: 94.6 kg 95 kg 91.9 kg    Physical Examination:  General exam: Appears calm and comfortable  Respiratory system: Clear to auscultation. Respiratory effort normal. Cardiovascular system: S1 & S2 heard, RRR. No JVD, murmurs, rubs, gallops or clicks.  Trace pedal edema. Gastrointestinal system: Abdomen is nondistended, soft and nontender. No organomegaly or masses felt. Normal bowel sounds heard. Central nervous system: Alert and oriented. No focal neurological deficits. Extremities: Symmetric 5 x 5 power. Skin: No rashes, lesions or ulcers Psychiatry: Judgement  and insight appear normal. Mood & affect appropriate.     Data Reviewed: I have personally reviewed following labs and imaging studies  CBC: Recent Labs  Lab 05/05/18 2018 05/06/18 0524 05/08/18 0505  WBC 8.1 7.7 7.8  NEUTROABS 6.3 5.5  --   HGB 10.1* 9.7* 9.8*  HCT 32.9* 31.4* 30.8*  MCV 94.3 93.7 91.7  PLT 212 217 854   Basic Metabolic Panel: Recent Labs  Lab 05/05/18 2018 05/06/18 0524 05/07/18 0456 05/08/18 0505  NA 141 139 142 139  K 4.4 4.4 4.0 3.8  CL 113* 113* 115* 111  CO2 15* 17* 17* 19*  GLUCOSE 276* 234* 154* 184*  BUN 46* 48* 47* 46*  CREATININE 1.91* 1.86* 1.71* 1.70*  CALCIUM 8.2* 8.2* 8.2* 7.9*   GFR: Estimated Creatinine Clearance: 30 mL/min (A) (by C-G formula based on SCr of 1.7 mg/dL (H)). Liver Function Tests: Recent Labs  Lab 05/05/18 2018 05/08/18 0505  AST 67* 19  ALT 91* 49*  ALKPHOS 72 63  BILITOT 0.7 0.7  PROT 5.2* 4.9*  ALBUMIN 2.5* 2.3*   No results for input(s): LIPASE, AMYLASE in the last 168 hours. No results for input(s): AMMONIA in the last 168 hours. Coagulation Profile: No results for input(s): INR, PROTIME in the last 168 hours. Cardiac Enzymes: Recent Labs  Lab 05/05/18 2018 05/06/18 0208  TROPONINI 12.77* 15.07*   BNP (last 3 results) No results for input(s): PROBNP in the last 8760 hours. HbA1C: Recent Labs  05/07/18 0456  HGBA1C 8.7*   CBG: Recent Labs  Lab 05/07/18 1701 05/07/18 2145 05/08/18 0630 05/08/18 1140 05/08/18 1633  GLUCAP 156* 191* 161* 168* 164*   Lipid Profile: Recent Labs    05/07/18 0456  CHOL 146  HDL 42  LDLCALC 78  TRIG 132  CHOLHDL 3.5   Thyroid Function Tests: No results for input(s): TSH, T4TOTAL, FREET4, T3FREE, THYROIDAB in the last 72 hours. Anemia Panel: Recent Labs    05/07/18 0456  VITAMINB12 203  FOLATE 9.7  FERRITIN 78  TIBC 241*  IRON 53  RETICCTPCT 1.9   Sepsis Labs: No results for input(s): PROCALCITON, LATICACIDVEN in the last 168  hours.  No results found for this or any previous visit (from the past 240 hour(s)).    Radiology Studies: Dg Chest 2 View  Result Date: 05/08/2018 CLINICAL DATA:  Shortness of breath EXAM: CHEST - 2 VIEW COMPARISON:  May 05, 2018 FINDINGS: Central catheter tip is in the superior vena cava. No pneumothorax. There is atelectasis in the right mid lung. There is airspace consolidation in the left lower lobe posteriorly. There is cardiomegaly with pulmonary vascularity within normal limits. No adenopathy. No bone lesions. IMPRESSION: Posterior left base consolidation, likely pneumonia. Mild right midlung atelectasis. Lungs elsewhere clear. Stable cardiomegaly. Central catheter tip in superior vena cava. No pneumothorax. Followup PA and lateral chest radiographs recommended in 3-4 weeks following trial of antibiotic therapy to ensure resolution and exclude underlying malignancy. Electronically Signed   By: Lowella Grip III M.D.   On: 05/08/2018 10:51        Scheduled Meds: . aspirin EC  81 mg Oral Daily  . atorvastatin  40 mg Oral q1800  . clopidogrel  75 mg Oral Q breakfast  . furosemide  40 mg Oral Daily  . heparin  5,000 Units Subcutaneous Q8H  . hydrALAZINE  25 mg Oral Q8H  . insulin aspart  0-15 Units Subcutaneous TID WC  . insulin glargine  8 Units Subcutaneous QHS  . Living Better with Heart Failure Book   Does not apply Once  . mouth rinse  15 mL Mouth Rinse BID  . metoprolol succinate  100 mg Oral Daily  . sodium chloride flush  10-40 mL Intracatheter Q12H  . sodium chloride flush  3 mL Intravenous Q12H   Continuous Infusions: . sodium chloride      Assessment & Plan:    1 Non-STEMI: Troponin peaked to 40.  Seen by cardiology.  Status post PTCA.  Aspirin and Plavix for 12 months.  Continue statins and beta-blockers.  2.  New systolic cardiomyopathy: Secondary to problem #1.  EF at 30 to 35%.  On beta-blockers, hydralazine and Lasix 40 mg daily.  Not a candidate for  ACE/ARB/Aldactone due to AKI.  3.  Acute on chronic renal failure stage III: Post cardiac cath, creatinine was elevated.  Baseline creatinine around 1.5.  Creatinine peaked to 1.9 and plateaued around 1.7.  Received mild hydration overnight but no significant change.  Lasix resumed today.  4.  Diabetes mellitus: DKA present on initial presentation.  Now resolved.  Continue Lantus and sliding scale insulin.  Not a candidate for Amaryl or Actos or metformin.  Will add low-dose Tradjenta  6.  Anemia of chronic disease: Likely related to problem #3.  Hemoglobin stable around 9.  Will need outpatient CBC follow-up given patient on dual antiplatelet agents now.  7.  Hyperlipidemia: Statins  8.  Posterior left lower lobe consolidation: Pneumonia versus atelectasis.  Chest x-ray on 05/04/2018 showed patchy perihilar lung opacities, slightly improved, which may represent improving pulmonary edema, although a component of pneumonia is not excluded.? Treated at outside facility with IV antibiotics but does not appear to have been resumed here.  Chest x-ray on January 23 reported  Posterior left base consolidation, likely pneumonia. Radiologist recommends follow-up chest x-ray in 3 weeks after empiric antibiotic treatment.  No evidence of leukocytosis or fever since presentation here.  DVT prophylaxis: Heparin Code Status: Full code Family / Patient Communication: Discussed with patient bedside. Disposition Plan: PT recommends short-term rehab.  Possible discharge in a.m.    LOS: 3 days    Time spent: 35 minutes    Guilford Shi, MD [facility] Pager [phone]  If 7PM-7AM, please contact night-coverage [email or web address] Password [facility] 05/08/2018, 7:27 PM

## 2018-05-09 DIAGNOSIS — Z743 Need for continuous supervision: Secondary | ICD-10-CM | POA: Diagnosis not present

## 2018-05-09 DIAGNOSIS — J90 Pleural effusion, not elsewhere classified: Secondary | ICD-10-CM | POA: Diagnosis not present

## 2018-05-09 DIAGNOSIS — I130 Hypertensive heart and chronic kidney disease with heart failure and stage 1 through stage 4 chronic kidney disease, or unspecified chronic kidney disease: Secondary | ICD-10-CM | POA: Diagnosis present

## 2018-05-09 DIAGNOSIS — M1990 Unspecified osteoarthritis, unspecified site: Secondary | ICD-10-CM | POA: Diagnosis present

## 2018-05-09 DIAGNOSIS — Z741 Need for assistance with personal care: Secondary | ICD-10-CM | POA: Diagnosis not present

## 2018-05-09 DIAGNOSIS — Z7902 Long term (current) use of antithrombotics/antiplatelets: Secondary | ICD-10-CM | POA: Diagnosis not present

## 2018-05-09 DIAGNOSIS — T68XXXA Hypothermia, initial encounter: Secondary | ICD-10-CM | POA: Diagnosis not present

## 2018-05-09 DIAGNOSIS — R269 Unspecified abnormalities of gait and mobility: Secondary | ICD-10-CM | POA: Diagnosis not present

## 2018-05-09 DIAGNOSIS — E871 Hypo-osmolality and hyponatremia: Secondary | ICD-10-CM | POA: Diagnosis present

## 2018-05-09 DIAGNOSIS — N183 Chronic kidney disease, stage 3 (moderate): Secondary | ICD-10-CM | POA: Diagnosis not present

## 2018-05-09 DIAGNOSIS — D649 Anemia, unspecified: Secondary | ICD-10-CM | POA: Diagnosis not present

## 2018-05-09 DIAGNOSIS — N184 Chronic kidney disease, stage 4 (severe): Secondary | ICD-10-CM | POA: Diagnosis present

## 2018-05-09 DIAGNOSIS — E7800 Pure hypercholesterolemia, unspecified: Secondary | ICD-10-CM | POA: Diagnosis present

## 2018-05-09 DIAGNOSIS — D62 Acute posthemorrhagic anemia: Secondary | ICD-10-CM | POA: Diagnosis present

## 2018-05-09 DIAGNOSIS — R4182 Altered mental status, unspecified: Secondary | ICD-10-CM | POA: Diagnosis not present

## 2018-05-09 DIAGNOSIS — G9341 Metabolic encephalopathy: Secondary | ICD-10-CM | POA: Diagnosis present

## 2018-05-09 DIAGNOSIS — Z955 Presence of coronary angioplasty implant and graft: Secondary | ICD-10-CM | POA: Diagnosis not present

## 2018-05-09 DIAGNOSIS — M6281 Muscle weakness (generalized): Secondary | ICD-10-CM | POA: Diagnosis not present

## 2018-05-09 DIAGNOSIS — I119 Hypertensive heart disease without heart failure: Secondary | ICD-10-CM | POA: Diagnosis not present

## 2018-05-09 DIAGNOSIS — I959 Hypotension, unspecified: Secondary | ICD-10-CM | POA: Diagnosis not present

## 2018-05-09 DIAGNOSIS — K209 Esophagitis, unspecified: Secondary | ICD-10-CM | POA: Diagnosis not present

## 2018-05-09 DIAGNOSIS — R262 Difficulty in walking, not elsewhere classified: Secondary | ICD-10-CM | POA: Diagnosis not present

## 2018-05-09 DIAGNOSIS — N186 End stage renal disease: Secondary | ICD-10-CM | POA: Diagnosis not present

## 2018-05-09 DIAGNOSIS — R278 Other lack of coordination: Secondary | ICD-10-CM | POA: Diagnosis not present

## 2018-05-09 DIAGNOSIS — Z794 Long term (current) use of insulin: Secondary | ICD-10-CM | POA: Diagnosis not present

## 2018-05-09 DIAGNOSIS — N179 Acute kidney failure, unspecified: Secondary | ICD-10-CM | POA: Diagnosis present

## 2018-05-09 DIAGNOSIS — R531 Weakness: Secondary | ICD-10-CM | POA: Diagnosis not present

## 2018-05-09 DIAGNOSIS — I4891 Unspecified atrial fibrillation: Secondary | ICD-10-CM | POA: Diagnosis present

## 2018-05-09 DIAGNOSIS — E8770 Fluid overload, unspecified: Secondary | ICD-10-CM | POA: Diagnosis not present

## 2018-05-09 DIAGNOSIS — R5381 Other malaise: Secondary | ICD-10-CM | POA: Diagnosis present

## 2018-05-09 DIAGNOSIS — R4781 Slurred speech: Secondary | ICD-10-CM | POA: Diagnosis not present

## 2018-05-09 DIAGNOSIS — R633 Feeding difficulties: Secondary | ICD-10-CM | POA: Diagnosis not present

## 2018-05-09 DIAGNOSIS — E1122 Type 2 diabetes mellitus with diabetic chronic kidney disease: Secondary | ICD-10-CM | POA: Diagnosis present

## 2018-05-09 DIAGNOSIS — R680 Hypothermia, not associated with low environmental temperature: Secondary | ICD-10-CM | POA: Diagnosis present

## 2018-05-09 DIAGNOSIS — K219 Gastro-esophageal reflux disease without esophagitis: Secondary | ICD-10-CM | POA: Diagnosis present

## 2018-05-09 DIAGNOSIS — N189 Chronic kidney disease, unspecified: Secondary | ICD-10-CM | POA: Diagnosis not present

## 2018-05-09 DIAGNOSIS — I5022 Chronic systolic (congestive) heart failure: Secondary | ICD-10-CM | POA: Diagnosis present

## 2018-05-09 DIAGNOSIS — K269 Duodenal ulcer, unspecified as acute or chronic, without hemorrhage or perforation: Secondary | ICD-10-CM | POA: Diagnosis not present

## 2018-05-09 DIAGNOSIS — R404 Transient alteration of awareness: Secondary | ICD-10-CM | POA: Diagnosis not present

## 2018-05-09 DIAGNOSIS — E1165 Type 2 diabetes mellitus with hyperglycemia: Secondary | ICD-10-CM | POA: Diagnosis present

## 2018-05-09 DIAGNOSIS — E872 Acidosis: Secondary | ICD-10-CM | POA: Diagnosis present

## 2018-05-09 DIAGNOSIS — K264 Chronic or unspecified duodenal ulcer with hemorrhage: Secondary | ICD-10-CM | POA: Diagnosis present

## 2018-05-09 DIAGNOSIS — I25119 Atherosclerotic heart disease of native coronary artery with unspecified angina pectoris: Secondary | ICD-10-CM | POA: Diagnosis not present

## 2018-05-09 DIAGNOSIS — R0902 Hypoxemia: Secondary | ICD-10-CM | POA: Diagnosis not present

## 2018-05-09 DIAGNOSIS — I499 Cardiac arrhythmia, unspecified: Secondary | ICD-10-CM | POA: Diagnosis not present

## 2018-05-09 DIAGNOSIS — I10 Essential (primary) hypertension: Secondary | ICD-10-CM | POA: Diagnosis not present

## 2018-05-09 DIAGNOSIS — E1151 Type 2 diabetes mellitus with diabetic peripheral angiopathy without gangrene: Secondary | ICD-10-CM | POA: Diagnosis not present

## 2018-05-09 DIAGNOSIS — K922 Gastrointestinal hemorrhage, unspecified: Secondary | ICD-10-CM | POA: Diagnosis not present

## 2018-05-09 DIAGNOSIS — J9811 Atelectasis: Secondary | ICD-10-CM | POA: Diagnosis not present

## 2018-05-09 DIAGNOSIS — E785 Hyperlipidemia, unspecified: Secondary | ICD-10-CM | POA: Diagnosis present

## 2018-05-09 DIAGNOSIS — R279 Unspecified lack of coordination: Secondary | ICD-10-CM | POA: Diagnosis not present

## 2018-05-09 DIAGNOSIS — K921 Melena: Secondary | ICD-10-CM | POA: Diagnosis not present

## 2018-05-09 DIAGNOSIS — K2210 Ulcer of esophagus without bleeding: Secondary | ICD-10-CM | POA: Diagnosis present

## 2018-05-09 DIAGNOSIS — I2510 Atherosclerotic heart disease of native coronary artery without angina pectoris: Secondary | ICD-10-CM | POA: Diagnosis present

## 2018-05-09 DIAGNOSIS — N390 Urinary tract infection, site not specified: Secondary | ICD-10-CM | POA: Diagnosis present

## 2018-05-09 DIAGNOSIS — I214 Non-ST elevation (NSTEMI) myocardial infarction: Secondary | ICD-10-CM | POA: Diagnosis present

## 2018-05-09 DIAGNOSIS — Z992 Dependence on renal dialysis: Secondary | ICD-10-CM | POA: Diagnosis not present

## 2018-05-09 LAB — BASIC METABOLIC PANEL
Anion gap: 7 (ref 5–15)
BUN: 41 mg/dL — ABNORMAL HIGH (ref 8–23)
CO2: 20 mmol/L — ABNORMAL LOW (ref 22–32)
Calcium: 8.2 mg/dL — ABNORMAL LOW (ref 8.9–10.3)
Chloride: 113 mmol/L — ABNORMAL HIGH (ref 98–111)
Creatinine, Ser: 1.61 mg/dL — ABNORMAL HIGH (ref 0.44–1.00)
GFR calc Af Amer: 35 mL/min — ABNORMAL LOW (ref >60)
GFR calc non Af Amer: 30 mL/min — ABNORMAL LOW (ref >60)
Glucose, Bld: 126 mg/dL — ABNORMAL HIGH (ref 70–99)
POTASSIUM: 3.7 mmol/L (ref 3.5–5.1)
Sodium: 140 mmol/L (ref 135–145)

## 2018-05-09 LAB — GLUCOSE, CAPILLARY
Glucose-Capillary: 118 mg/dL — ABNORMAL HIGH (ref 70–99)
Glucose-Capillary: 145 mg/dL — ABNORMAL HIGH (ref 70–99)
Glucose-Capillary: 175 mg/dL — ABNORMAL HIGH (ref 70–99)

## 2018-05-09 MED ORDER — CLOPIDOGREL BISULFATE 75 MG PO TABS: 75.0000 mg | ORAL_TABLET | Freq: Every day | ORAL | 1 refills | Status: AC

## 2018-05-09 MED ORDER — ATORVASTATIN CALCIUM 40 MG PO TABS: 40.0000 mg | ORAL_TABLET | Freq: Every day | ORAL | 1 refills | Status: AC

## 2018-05-09 MED ORDER — INSULIN GLARGINE 100 UNIT/ML ~~LOC~~ SOLN: 8.0000 [IU] | Freq: Every day | SUBCUTANEOUS | 11 refills | Status: AC

## 2018-05-09 MED ORDER — LINAGLIPTIN 5 MG PO TABS
2.5000 mg | ORAL_TABLET | Freq: Every day | ORAL | Status: DC
  Administered 2018-05-09: 10:00:00 2.5 mg via ORAL
  Filled 2018-05-09: qty 1

## 2018-05-09 MED ORDER — HYDRALAZINE HCL 25 MG PO TABS: 25.0000 mg | ORAL_TABLET | Freq: Three times a day (TID) | ORAL | 1 refills | Status: AC

## 2018-05-09 MED ORDER — FUROSEMIDE 40 MG PO TABS: 40.0000 mg | ORAL_TABLET | Freq: Every day | ORAL | 0 refills | Status: AC

## 2018-05-09 MED ORDER — NITROGLYCERIN 0.4 MG SL SUBL: 0.4000 mg | SUBLINGUAL_TABLET | SUBLINGUAL | Status: DC | PRN

## 2018-05-09 MED ORDER — NITROGLYCERIN 0.4 MG SL SUBL: 0.4000 mg | SUBLINGUAL_TABLET | SUBLINGUAL | 12 refills | Status: AC | PRN

## 2018-05-09 MED ORDER — LINAGLIPTIN 5 MG PO TABS: 2.5000 mg | ORAL_TABLET | Freq: Every day | ORAL | 1 refills | Status: AC

## 2018-05-09 MED ORDER — CEFDINIR 300 MG PO CAPS: 300.0000 mg | ORAL_CAPSULE | Freq: Two times a day (BID) | ORAL | 0 refills | Status: AC

## 2018-05-09 MED ORDER — DOXYCYCLINE HYCLATE 100 MG PO TABS: 100.0000 mg | ORAL_TABLET | Freq: Two times a day (BID) | ORAL | 0 refills | Status: AC

## 2018-05-09 MED ORDER — METOPROLOL SUCCINATE ER 100 MG PO TB24: 100.0000 mg | ORAL_TABLET | Freq: Every day | ORAL | 0 refills | Status: AC

## 2018-05-09 MED ORDER — LINAGLIPTIN 5 MG PO TABS: 5.0000 mg | ORAL_TABLET | Freq: Every day | ORAL | Status: DC

## 2018-05-09 NOTE — Discharge Summary (Signed)
Physician Discharge Summary  Ashley Fuller ZOX:[id number] DOB: 01/18/1941 DOA: 05/05/2018  PCP: Raina Mina., MD  Admit date: 05/05/2018 Discharge date: 05/09/2018 Consultations: Cardiology Admitted From: home Disposition:  SNF rehab  Discharge Diagnoses:  Principal Problem:   CAD (coronary artery disease) Active Problems:   NSTEMI (non-ST elevated myocardial infarction) ([place])   Controlled type 2 diabetes mellitus with chronic kidney disease on chronic dialysis, with long-term current use of insulin ([place])   Hyperlipidemia LDL goal <70   Hypertension   CKD stage 3 due to type 2 diabetes mellitus ([place])   Brief/Interim Summary: 78 year old female with history of CAD status post PTCA, chronic kidney disease stage III, diabetes, hyperlipidemia and hypertension presented to [facility] with pneumonia, lactic acidosis and DKA.  She also had non-STEMI with troponin peaking to 40.  She was transferred to [facility] health on January 20 and underwent cardiac cath, PCI proximal to mid LAD due to severe ISR in an angiographically/fluoroscopically underexpanded LAD stent > successful PTCA and restenting of the proximal to mid LAD on January 21.  1 Non-STEMI: Troponin peaked to 40.  Seen by cardiology.  Status post PTCA.  Aspirin and Plavix for 12 months.  Continue statins and beta-blockers.  2.  New systolic cardiomyopathy: Secondary to problem #1.  EF at 30 to 35%.  On beta-blockers, hydralazine and Lasix 40 mg daily.  Not a candidate for ACE/ARB/Aldactone due to AKI.  3.  Acute on chronic renal failure stage III: Post cardiac cath, creatinine was elevated.  Baseline creatinine around 1.5.  Creatinine peaked to 1.9 and plateaued around 1.7.  Received mild hydration overnight but no significant change. Oral Lasix resumed at 40 mg by cardiology on 1/23 and creatinine stable/improved at 1.6 on labs today.  4.  Diabetes mellitus: DKA present on initial presentation.  Now resolved.  Continue  Lantus at 8 units (was taking Lantus in an inconsistent way at home).  Not a candidate for Amaryl or Actos or metformin. Added low-dose Tradjenta.Can possibly transition off insulin at SNF by titrating Tradjenta dose.   6.  Anemia of chronic disease: Likely related to problem #3.  Hemoglobin stable around 9.  Will need outpatient CBC follow-up given patient on dual antiplatelet agents now.  7.  Hyperlipidemia: Statins changed to Lipitor  8.  Posterior left lower lobe consolidation: Pneumonia versus atelectasis.  Chest x-ray on 05/04/2018 showed patchy perihilar lung opacities, slightly improved, which may represent improving pulmonary edema, although a component of pneumonia is not excluded.Treated at outside facility with IV antibiotics prior to transfer here but does not appear to have been resumed here.  Chest x-ray on January 23 reported  posterior left base consolidation, likely pneumonia. Radiologist recommends follow-up chest x-ray in 3 weeks after empiric antibiotic treatment.  No evidence of leukocytosis or fever since presentation here. Started on cefdinir/doxycycline can discontinue after 5 days (1/28)  9. HTN: discontinued Norvasc as BP on low side with low EF and  b-blockers on board   Discharge Exam: Vitals:   05/09/18 0604 05/09/18 0716  BP: 127/68 119/61  Pulse: 71 (!) 56  Resp: (!) 24 (!) 24  Temp:  97.7 F (36.5 C)  SpO2: 93% 94%   Vitals:   05/08/18 2054 05/09/18 0422 05/09/18 0604 05/09/18 0716  BP: 132/69 (!) 100/55 127/68 119/61  Pulse:  64 71 (!) 56  Resp: (!) 21 20 (!) 24 (!) 24  Temp:  97.8 F (36.6 C)  97.7 F (36.5 C)  TempSrc:  Oral  Oral  SpO2:  93% 93% 94%  Weight:  93.5 kg    Height:        General: Pt is alert, awake, not in acute distress, saturating well on RA Cardiovascular: RRR, S1/S2 +, no rubs, no gallops Respiratory: CTA bilaterally, no wheezing, no rhonchi Abdominal: obese, Soft, NT, ND, bowel sounds + Extremities: no edema, no  cyanosis  Discharge Instructions  Discharge Instructions    (HEART FAILURE PATIENTS) Call MD:  Anytime you have any of the following symptoms: 1) 3 pound weight gain in 24 hours or 5 pounds in 1 week 2) shortness of breath, with or without a dry hacking cough 3) swelling in the hands, feet or stomach 4) if you have to sleep on extra pillows at night in order to breathe.   Complete by:  As directed    Amb Referral to Cardiac Rehabilitation   Complete by:  As directed    Will refer to CRP [facility] [place]   Diagnosis:   Coronary Stents NSTEMI     Call MD for:  difficulty breathing, headache or visual disturbances   Complete by:  As directed    Call MD for:  extreme fatigue   Complete by:  As directed    Call MD for:  persistant dizziness or light-headedness   Complete by:  As directed    Call MD for:  temperature >100.4   Complete by:  As directed    Diet - low sodium heart healthy   Complete by:  As directed    Increase activity slowly   Complete by:  As directed      Allergies as of 05/09/2018      Reactions   Codeine Other (See Comments)   Penicillins Other (See Comments)      Medication List    STOP taking these medications   amLODipine 10 MG tablet Commonly known as:  NORVASC   BRILINTA 90 MG Tabs tablet Generic drug:  ticagrelor   clotrimazole 1 % cream Commonly known as:  LOTRIMIN   glimepiride 2 MG tablet Commonly known as:  AMARYL   lisinopril 40 MG tablet Commonly known as:  PRINIVIL,ZESTRIL   lovastatin 40 MG tablet Commonly known as:  MEVACOR   metFORMIN 500 MG tablet Commonly known as:  GLUCOPHAGE   metoprolol tartrate 50 MG tablet Commonly known as:  LOPRESSOR   pioglitazone 15 MG tablet Commonly known as:  ACTOS   traMADol 50 MG tablet Commonly known as:  ULTRAM     TAKE these medications   aspirin EC 81 MG tablet Take 81 mg by mouth daily.   atorvastatin 40 MG tablet Commonly known as:  LIPITOR Take 1 tablet (40 mg total) by mouth daily  at 6 PM.   cefdinir 300 MG capsule Commonly known as:  OMNICEF Take 1 capsule (300 mg total) by mouth every 12 (twelve) hours for 5 days.   clopidogrel 75 MG tablet Commonly known as:  PLAVIX Take 1 tablet (75 mg total) by mouth daily with breakfast. Start taking on:  May 10, 2018   doxycycline 100 MG tablet Commonly known as:  VIBRA-TABS Take 1 tablet (100 mg total) by mouth every 12 (twelve) hours for 5 days.   furosemide 40 MG tablet Commonly known as:  LASIX Take 1 tablet (40 mg total) by mouth daily. Start taking on:  May 10, 2018 What changed:    medication strength  how much to take   hydrALAZINE 25 MG tablet Commonly known as:  APRESOLINE Take 1 tablet (25 mg total) by mouth every 8 (eight) hours.   insulin glargine 100 UNIT/ML injection Commonly known as:  LANTUS Inject 0.08 mLs (8 Units total) into the skin at bedtime. What changed:    how much to take  additional instructions   linagliptin 5 MG Tabs tablet Commonly known as:  TRADJENTA Take 0.5 tablets (2.5 mg total) by mouth daily. Start taking on:  May 10, 2018   metoprolol succinate 100 MG 24 hr tablet Commonly known as:  TOPROL-XL Take 1 tablet (100 mg total) by mouth daily for 30 days. Take with or immediately following a meal. Start taking on:  May 10, 2018   nitroGLYCERIN 0.4 MG SL tablet Commonly known as:  NITROSTAT Place 1 tablet (0.4 mg total) under the tongue every 5 (five) minutes as needed for chest pain.      Follow-up Information    Revankar, Reita Cliche, MD Follow up.   Specialty:  Cardiology Why:  [place] office - 05/16/2018 at 10:20am. Please arrive by 10am to register. If you and your family instead decide to follow up with Dr. Otho Perl, please do so within 1 week (and call office to let us know to cancel your appointment). Contact information: [place] [address] [phone]          Allergies  Allergen Reactions  . Codeine  Other (See Comments)  . Penicillins Other (See Comments)       Discharge Condition: stable CODE STATUS: Full code Diet recommendation: cardiac/diabetic  Recommendations for Outpatient Follow-up:  1. Follow up with PCP in 1-2 weeks, Cardiology as scheduled 2. Please obtain BMP/CBC in 5 days.  3. Please follow up lung infiltrate with repeat CXR in 3 weeks       The results of significant diagnostics from this hospitalization (including imaging, microbiology, ancillary and laboratory) are listed below for reference.     Microbiology: No results found for this or any previous visit (from the past 240 hour(s)).   Labs: BNP (last 3 results) No results for input(s): BNP in the last 8760 hours. Basic Metabolic Panel: Recent Labs  Lab 05/05/18 2018 05/06/18 0524 05/07/18 0456 05/08/18 0505 05/09/18 0427  NA 141 139 142 139 140  K 4.4 4.4 4.0 3.8 3.7  CL 113* 113* 115* 111 113*  CO2 15* 17* 17* 19* 20*  GLUCOSE 276* 234* 154* 184* 126*  BUN 46* 48* 47* 46* 41*  CREATININE 1.91* 1.86* 1.71* 1.70* 1.61*  CALCIUM 8.2* 8.2* 8.2* 7.9* 8.2*   Liver Function Tests: Recent Labs  Lab 05/05/18 2018 05/08/18 0505  AST 67* 19  ALT 91* 49*  ALKPHOS 72 63  BILITOT 0.7 0.7  PROT 5.2* 4.9*  ALBUMIN 2.5* 2.3*   No results for input(s): LIPASE, AMYLASE in the last 168 hours. No results for input(s): AMMONIA in the last 168 hours. CBC: Recent Labs  Lab 05/05/18 2018 05/06/18 0524 05/08/18 0505  WBC 8.1 7.7 7.8  NEUTROABS 6.3 5.5  --   HGB 10.1* 9.7* 9.8*  HCT 32.9* 31.4* 30.8*  MCV 94.3 93.7 91.7  PLT 212 217 240   Cardiac Enzymes: Recent Labs  Lab 05/05/18 2018 05/06/18 0208  TROPONINI 12.77* 15.07*   BNP: Invalid input(s): POCBNP CBG: Recent Labs  Lab 05/08/18 1140 05/08/18 1633 05/08/18 2108 05/09/18 0647 05/09/18 1148  GLUCAP 168* 164* 159* 118* 145*   D-Dimer No results for input(s): DDIMER in the last 72 hours. Hgb A1c Recent Labs  05/07/18 0456  HGBA1C 8.7*   Lipid Profile Recent Labs    05/07/18 0456  CHOL 146  HDL 42  LDLCALC 78  TRIG 132  CHOLHDL 3.5   Thyroid function studies No results for input(s): TSH, T4TOTAL, T3FREE, THYROIDAB in the last 72 hours.  Invalid input(s): FREET3 Anemia work up Recent Labs    05/07/18 0456  VITAMINB12 203  FOLATE 9.7  FERRITIN 78  TIBC 241*  IRON 53  RETICCTPCT 1.9   Urinalysis No results found for: COLORURINE, APPEARANCEUR, LABSPEC, [place], GLUCOSEU, HGBUR, BILIRUBINUR, KETONESUR, PROTEINUR, UROBILINOGEN, NITRITE, LEUKOCYTESUR Sepsis Labs Invalid input(s): PROCALCITONIN,  WBC,  LACTICIDVEN Microbiology No results found for this or any previous visit (from the past 240 hour(s)).  Procedures/Studies: Dg Chest 2 View  Result Date: 05/08/2018 CLINICAL DATA:  Shortness of breath EXAM: CHEST - 2 VIEW COMPARISON:  May 05, 2018 FINDINGS: Central catheter tip is in the superior vena cava. No pneumothorax. There is atelectasis in the right mid lung. There is airspace consolidation in the left lower lobe posteriorly. There is cardiomegaly with pulmonary vascularity within normal limits. No adenopathy. No bone lesions. IMPRESSION: Posterior left base consolidation, likely pneumonia. Mild right midlung atelectasis. Lungs elsewhere clear. Stable cardiomegaly. Central catheter tip in superior vena cava. No pneumothorax. Followup PA and lateral chest radiographs recommended in 3-4 weeks following trial of antibiotic therapy to ensure resolution and exclude underlying malignancy. Electronically Signed   By: Lowella Grip III M.D.   On: 05/08/2018 10:51   [place] Ekg Site Rite  Result Date: 05/05/2018 If Site Rite image not attached, placement could not be confirmed due to current cardiac rhythm.   (Echo, Carotid, EGD, Colonoscopy, ERCP)  Time coordinating discharge: Over 30 minutes  SIGNED:   Guil[place] Shi, MD  [facility] 05/09/2018, 1:48 PM Pager   If  7PM-7AM, please contact night-coverage [email or web address] Password [email or web address]

## 2018-05-09 NOTE — Clinical Social Work Placement (Signed)
   CLINICAL SOCIAL WORK PLACEMENT  NOTE *05/09/18 - DISCHARGED TO [facility]  Date:  05/09/2018  Patient Details  Name: Ashley Fuller MRN: [id number] Date of Birth: 1940/04/29  Clinical Social Work is seeking post-discharge placement for this patient at the [place] level of care (*CSW will initial, date and re-position this form in  chart as items are completed):  Yes   Patient/family provided with [place] Work Department's list of facilities offering this level of care within the geographic area requested by the patient (or if unable, by the patient's family).  Yes   Patient/family informed of their freedom to choose among providers that offer the needed level of care, that participate in Medicare, Medicaid or managed care program needed by the patient, have an available bed and are willing to accept the patient.  Yes   Patient/family informed of [facility]'s ownership interest in [facility] and [facility], as well as of the fact that they are under no obligation to receive care at these facilities.  PASRR submitted to EDS on       PASRR number received on       Existing PASRR number confirmed on 05/08/18     FL2 transmitted to all facilities in geographic area requested by pt/family on 05/08/18     FL2 transmitted to all facilities within larger geographic area on       Patient informed that his/her managed care company has contracts with or will negotiate with certain facilities, including the following:         05/08/18 - Patient/family informed of bed offers received.  Patient chooses bed at  [facility] in [place] recommends and patient chooses bed at      Patient to be transferred to  [facility] on  05/09/18.  Patient to be transferred to facility by  ambulance     Patient family notified on  05/09/18 of transfer.  Name of family member notified:   Cindee Lame -  son     PHYSICIAN       Additional Comment:    _______________________________________________ Sable Feil, LCSW 05/09/2018, 2:49 PM

## 2018-05-13 DIAGNOSIS — N183 Chronic kidney disease, stage 3 (moderate): Secondary | ICD-10-CM | POA: Diagnosis not present

## 2018-05-13 DIAGNOSIS — E1151 Type 2 diabetes mellitus with diabetic peripheral angiopathy without gangrene: Secondary | ICD-10-CM | POA: Diagnosis not present

## 2018-05-13 DIAGNOSIS — I2510 Atherosclerotic heart disease of native coronary artery without angina pectoris: Secondary | ICD-10-CM | POA: Diagnosis not present

## 2018-05-13 DIAGNOSIS — R262 Difficulty in walking, not elsewhere classified: Secondary | ICD-10-CM | POA: Diagnosis not present

## 2018-05-16 ENCOUNTER — Encounter: Payer: Self-pay | Admitting: Cardiology

## 2018-05-16 ENCOUNTER — Ambulatory Visit (INDEPENDENT_AMBULATORY_CARE_PROVIDER_SITE_OTHER): Payer: Medicare Other | Admitting: Cardiology

## 2018-05-16 VITALS — BP 128/62 | HR 75 | Ht 64.0 in

## 2018-05-16 DIAGNOSIS — N183 Chronic kidney disease, stage 3 unspecified: Secondary | ICD-10-CM

## 2018-05-16 DIAGNOSIS — E1122 Type 2 diabetes mellitus with diabetic chronic kidney disease: Secondary | ICD-10-CM | POA: Diagnosis not present

## 2018-05-16 DIAGNOSIS — I214 Non-ST elevation (NSTEMI) myocardial infarction: Secondary | ICD-10-CM

## 2018-05-16 DIAGNOSIS — Z794 Long term (current) use of insulin: Secondary | ICD-10-CM

## 2018-05-16 DIAGNOSIS — N186 End stage renal disease: Secondary | ICD-10-CM | POA: Diagnosis not present

## 2018-05-16 DIAGNOSIS — I10 Essential (primary) hypertension: Secondary | ICD-10-CM | POA: Diagnosis not present

## 2018-05-16 DIAGNOSIS — E785 Hyperlipidemia, unspecified: Secondary | ICD-10-CM | POA: Diagnosis not present

## 2018-05-16 DIAGNOSIS — I2510 Atherosclerotic heart disease of native coronary artery without angina pectoris: Secondary | ICD-10-CM | POA: Diagnosis not present

## 2018-05-16 DIAGNOSIS — Z992 Dependence on renal dialysis: Secondary | ICD-10-CM

## 2018-05-16 NOTE — Patient Instructions (Signed)
Medication Instructions:  Your physician recommends that you continue on your current medications as directed. Please refer to the Current Medication list given to you today.  If you need a refill on your cardiac medications before your next appointment, please call your pharmacy.   Lab work: None If you have labs (blood work) drawn today and your tests are completely normal, you will receive your results only by: . MyChart Message (if you have MyChart) OR . A paper copy in the mail If you have any lab test that is abnormal or we need to change your treatment, we will call you to review the results.  Testing/Procedures: None  Follow-Up: At [facility], you and your health needs are our priority.  As part of our continuing mission to provide you with exceptional heart care, we have created designated Provider Care Teams.  These Care Teams include your primary Cardiologist (physician) and Advanced Practice Providers (APPs -  Physician Assistants and Nurse Practitioners) who all work together to provide you with the care you need, when you need it. You will need a follow up appointment in 6 months.  Please call our office 2 months in advance to schedule this appointment.  You may see No primary care provider on file. or another member of our [facility] Provider Team in [place]: Robert Krasowski, MD . Brian Munley, MD  Any Other Special Instructions Will Be Listed Below (If Applicable).    

## 2018-05-16 NOTE — Progress Notes (Signed)
Cardiology Office Note:    Date:  05/16/2018   ID:  Ashley Fuller, DOB [birth year], MRN [id number]  PCP:  Raina Mina., MD  Cardiologist:  Jenean Lindau, MD   Referring MD: Raina Mina., MD    ASSESSMENT:    1. Coronary artery disease involving native coronary artery of native heart without angina pectoris   2. Hyperlipidemia LDL goal <70   3. Essential hypertension   4. NSTEMI (non-ST elevated myocardial infarction) ([place])   5. CKD stage 3 due to type 2 diabetes mellitus ([place])   6. Controlled type 2 diabetes mellitus with chronic kidney disease on chronic dialysis, with long-term current use of insulin ([facility])    PLAN:    In order of problems listed above:  1. I discussed my findings with the patient at extensive length.  Secondary prevention stressed.  Importance of compliance with diet and medication stressed and she vocalized understanding.  Coronary angiography report was discussed with them extensively and questions were answered to their satisfaction. 2. Her blood pressure is stable and lipids will be followed by primary care physician so will the renal function be monitored by them. 3. Patient has significant ecchymosis on the left extremity where she underwent coronary angiography.  Also the back of her has some swelling it appears to be some ecchymosis related.  Her radial pulse can be felt.  Her strength in her hand is within normal limits.  There is no increased warmth and no clinical evidence of infection.  I told her to be in touch with the doctors at the facility to monitor this closely and give [place] a call if they need any more additional assistance. 4. Patient will be seen in follow-up appointment in 6 months or earlier if the patient has any concerns 5.    Medication Adjustments/Labs and Tests Ordered: Current medicines are reviewed at length with the patient today.  Concerns regarding medicines are outlined above.  No orders of the defined types were  placed in this encounter.  No orders of the defined types were placed in this encounter.    No chief complaint on file.    History of Present Illness:    Ashley Fuller is a 78 y.o. female.  Patient had a non-STEMI and went to [facility] where she underwent coronary intervention.  She lives in a facility and her daughter-in-law is here for the visit with her.  She has history of essential hypertension, poorly controlled diabetes mellitus, morbid obesity and is brought in in a wheelchair.  She has significant renal insufficiency.  She denies any problems at this time.  At the time of my evaluation, the patient is alert awake oriented and in no distress.  Past Medical History:  Diagnosis Date  . CAD (coronary artery disease)    a. NSTEMI with DES to prox LAD & 1st diag 08/2016  . CKD stage 3 due to type 2 diabetes mellitus ([place])   . Controlled type 2 diabetes mellitus with chronic kidney disease on chronic dialysis, with long-term current use of insulin ([place])   . Hyperlipidemia LDL goal <70   . Hypertension     Past Surgical History:  Procedure Laterality Date  . ABDOMINAL HYSTERECTOMY    . CARDIAC CATHETERIZATION  02/2017  . CARDIAC CATHETERIZATION  08/2016  . CESAREAN SECTION    . CORONARY ANGIOPLASTY WITH STENT PLACEMENT  08/2016  . CORONARY STENT INTERVENTION N/A 05/06/2018   Procedure: CORONARY STENT INTERVENTION;  Surgeon:  Belva Crome, MD;  Location: [facility];  Service: Cardiovascular;  Laterality: N/A;  . RIGHT/LEFT HEART CATH AND CORONARY ANGIOGRAPHY N/A 05/06/2018   Procedure: RIGHT/LEFT HEART CATH AND CORONARY ANGIOGRAPHY;  Surgeon: Belva Crome, MD;  Location: [facility];  Service: Cardiovascular;  Laterality: N/A;    Current Medications: Current Meds  Medication Sig  . aspirin EC 81 MG tablet Take 81 mg by mouth daily.  Marland Kitchen atorvastatin (LIPITOR) 40 MG tablet Take 1 tablet (40 mg total) by mouth daily at 6 PM.  . clopidogrel  (PLAVIX) 75 MG tablet Take 1 tablet (75 mg total) by mouth daily with breakfast.  . furosemide (LASIX) 40 MG tablet Take 1 tablet (40 mg total) by mouth daily.  . hydrALAZINE (APRESOLINE) 25 MG tablet Take 1 tablet (25 mg total) by mouth every 8 (eight) hours.  . insulin glargine (LANTUS) 100 UNIT/ML injection Inject 0.08 mLs (8 Units total) into the skin at bedtime.  Marland Kitchen linagliptin (TRADJENTA) 5 MG TABS tablet Take 0.5 tablets (2.5 mg total) by mouth daily.  . metoprolol succinate (TOPROL-XL) 100 MG 24 hr tablet Take 1 tablet (100 mg total) by mouth daily for 30 days. Take with or immediately following a meal.  . nitroGLYCERIN (NITROSTAT) 0.4 MG SL tablet Place 1 tablet (0.4 mg total) under the tongue every 5 (five) minutes as needed for chest pain.  Marland Kitchen ondansetron (ZOFRAN) 4 MG tablet Take 4 mg by mouth every 8 (eight) hours as needed for nausea or vomiting.  . pantoprazole (PROTONIX) 40 MG tablet Take 40 mg by mouth daily.     Allergies:   Hydrochlorothiazide and Codeine   Social History   Socioeconomic History  . Marital status: Widowed    Spouse name: Not on file  . Number of children: Not on file  . Years of education: Not on file  . Highest education level: Not on file  Occupational History  . Not on file  Social Needs  . Financial resource strain: Not on file  . Food insecurity:    Worry: Not on file    Inability: Not on file  . Transportation needs:    Medical: Not on file    Non-medical: Not on file  Tobacco Use  . Smoking status: Never Smoker  . Smokeless tobacco: Never Used  Substance and Sexual Activity  . Alcohol use: Not Currently  . Drug use: Never  . Sexual activity: Not on file  Lifestyle  . Physical activity:    Days per week: Not on file    Minutes per session: Not on file  . Stress: Not on file  Relationships  . Social connections:    Talks on phone: Not on file    Gets together: Not on file    Attends religious service: Not on file    Active member  of club or organization: Not on file    Attends meetings of clubs or organizations: Not on file    Relationship status: Not on file  Other Topics Concern  . Not on file  Social History Narrative  . Not on file     Family History: The patient's family history includes Bone cancer in her sister; COPD in her sister; Colon cancer in her sister; Congestive Heart Failure in her brother and sister; Hypertension in her brother; Pneumonia in her sister.  ROS:   Please see the history of present illness.    All other systems reviewed and are negative.  EKGs/Labs/Other  Studies Reviewed:    The following studies were reviewed today: CORONARY STENT INTERVENTION  RIGHT/LEFT HEART CATH AND CORONARY ANGIOGRAPHY  Conclusion    Non-ST elevation MI in the setting of DKA induced stress on the substrate of type proximal to mid LAD restenosis due to stent underexpansion from the procedure in 2018.  PCI and restenting reduced to 95% stenosis to less than 10% with TIMI grade III flow using a 3.0 x 12 mm Onyx and postdilated with a 3.25 x 12 Sapphire to 18 atm.  Left main is widely patent.  The LAD contains diffuse disease beyond the first diagonal origin up to 60 to 70%.  The first diagonal is large and the stent in the first diagonal is widely patent.  The circumflex gives origin to 3 obtuse marginal branches.  The mid circumflex contains an eccentric hazy 50% stenosis.  The right coronary contains diffuse 30 to 50% narrowing proximal to distal.  LVEDP 18 mmHg.  RECOMMENDATIONS:   Aspirin and Plavix for at least 12 months.  Aggressive risk factor modification.  Follow kidney function closely.  IV fluid, 50 cc/h for 18 hours.  Watch for any evidence of volume overload.      Recent Labs: 05/08/2018: ALT 49; Hemoglobin 9.8; Platelets 240 05/09/2018: BUN 41; Creatinine, Ser 1.61; Potassium 3.7; Sodium 140  Recent Lipid Panel    Component Value Date/Time   CHOL 146 05/07/2018 0456    TRIG 132 05/07/2018 0456   HDL 42 05/07/2018 0456   CHOLHDL 3.5 05/07/2018 0456   VLDL 26 05/07/2018 0456   LDLCALC 78 05/07/2018 0456    Physical Exam:    VS:  BP 128/62 (BP Location: Right Arm, Patient Position: Sitting, Cuff Size: Normal)   Pulse 75   Ht '5\' 4"'$  (1.626 m)   SpO2 91%   BMI 35.38 kg/m     Wt Readings from Last 3 Encounters:  05/09/18 206 lb 2.1 oz (93.5 kg)     GEN: Patient is in no acute distress HEENT: Normal NECK: No JVD; No carotid bruits LYMPHATICS: No lymphadenopathy CARDIAC: Hear sounds regular, 2/6 systolic murmur at the apex. RESPIRATORY:  Clear to auscultation without rales, wheezing or rhonchi  ABDOMEN: Soft, non-tender, non-distended MUSCULOSKELETAL:  No edema; No deformity  SKIN: Warm and dry NEUROLOGIC:  Alert and oriented x 3 PSYCHIATRIC:  Normal affect   Signed, Jenean Lindau, MD  05/16/2018 11:00 AM    [place]

## 2018-05-18 DIAGNOSIS — E1122 Type 2 diabetes mellitus with diabetic chronic kidney disease: Secondary | ICD-10-CM | POA: Diagnosis present

## 2018-05-18 DIAGNOSIS — E6609 Other obesity due to excess calories: Secondary | ICD-10-CM | POA: Diagnosis not present

## 2018-05-18 DIAGNOSIS — E871 Hypo-osmolality and hyponatremia: Secondary | ICD-10-CM | POA: Diagnosis present

## 2018-05-18 DIAGNOSIS — J90 Pleural effusion, not elsewhere classified: Secondary | ICD-10-CM | POA: Diagnosis not present

## 2018-05-18 DIAGNOSIS — N179 Acute kidney failure, unspecified: Secondary | ICD-10-CM | POA: Diagnosis present

## 2018-05-18 DIAGNOSIS — I480 Paroxysmal atrial fibrillation: Secondary | ICD-10-CM | POA: Diagnosis not present

## 2018-05-18 DIAGNOSIS — K921 Melena: Secondary | ICD-10-CM | POA: Diagnosis not present

## 2018-05-18 DIAGNOSIS — I4891 Unspecified atrial fibrillation: Secondary | ICD-10-CM | POA: Diagnosis present

## 2018-05-18 DIAGNOSIS — R0902 Hypoxemia: Secondary | ICD-10-CM | POA: Diagnosis not present

## 2018-05-18 DIAGNOSIS — Z452 Encounter for adjustment and management of vascular access device: Secondary | ICD-10-CM | POA: Diagnosis not present

## 2018-05-18 DIAGNOSIS — R4182 Altered mental status, unspecified: Secondary | ICD-10-CM | POA: Diagnosis not present

## 2018-05-18 DIAGNOSIS — N184 Chronic kidney disease, stage 4 (severe): Secondary | ICD-10-CM | POA: Diagnosis not present

## 2018-05-18 DIAGNOSIS — Z955 Presence of coronary angioplasty implant and graft: Secondary | ICD-10-CM | POA: Diagnosis not present

## 2018-05-18 DIAGNOSIS — R680 Hypothermia, not associated with low environmental temperature: Secondary | ICD-10-CM | POA: Diagnosis present

## 2018-05-18 DIAGNOSIS — I5022 Chronic systolic (congestive) heart failure: Secondary | ICD-10-CM | POA: Diagnosis present

## 2018-05-18 DIAGNOSIS — K2990 Gastroduodenitis, unspecified, without bleeding: Secondary | ICD-10-CM | POA: Diagnosis not present

## 2018-05-18 DIAGNOSIS — R54 Age-related physical debility: Secondary | ICD-10-CM | POA: Diagnosis not present

## 2018-05-18 DIAGNOSIS — E7800 Pure hypercholesterolemia, unspecified: Secondary | ICD-10-CM | POA: Diagnosis present

## 2018-05-18 DIAGNOSIS — M1990 Unspecified osteoarthritis, unspecified site: Secondary | ICD-10-CM | POA: Diagnosis present

## 2018-05-18 DIAGNOSIS — F0280 Dementia in other diseases classified elsewhere without behavioral disturbance: Secondary | ICD-10-CM | POA: Diagnosis not present

## 2018-05-18 DIAGNOSIS — Z7401 Bed confinement status: Secondary | ICD-10-CM | POA: Diagnosis not present

## 2018-05-18 DIAGNOSIS — Z8673 Personal history of transient ischemic attack (TIA), and cerebral infarction without residual deficits: Secondary | ICD-10-CM | POA: Diagnosis not present

## 2018-05-18 DIAGNOSIS — I213 ST elevation (STEMI) myocardial infarction of unspecified site: Secondary | ICD-10-CM | POA: Diagnosis not present

## 2018-05-18 DIAGNOSIS — N390 Urinary tract infection, site not specified: Secondary | ICD-10-CM | POA: Diagnosis present

## 2018-05-18 DIAGNOSIS — R404 Transient alteration of awareness: Secondary | ICD-10-CM | POA: Diagnosis not present

## 2018-05-18 DIAGNOSIS — E46 Unspecified protein-calorie malnutrition: Secondary | ICD-10-CM | POA: Diagnosis not present

## 2018-05-18 DIAGNOSIS — J9811 Atelectasis: Secondary | ICD-10-CM | POA: Diagnosis not present

## 2018-05-18 DIAGNOSIS — K922 Gastrointestinal hemorrhage, unspecified: Secondary | ICD-10-CM | POA: Diagnosis not present

## 2018-05-18 DIAGNOSIS — E785 Hyperlipidemia, unspecified: Secondary | ICD-10-CM | POA: Diagnosis present

## 2018-05-18 DIAGNOSIS — I10 Essential (primary) hypertension: Secondary | ICD-10-CM | POA: Diagnosis not present

## 2018-05-18 DIAGNOSIS — I499 Cardiac arrhythmia, unspecified: Secondary | ICD-10-CM | POA: Diagnosis not present

## 2018-05-18 DIAGNOSIS — R531 Weakness: Secondary | ICD-10-CM | POA: Diagnosis not present

## 2018-05-18 DIAGNOSIS — K269 Duodenal ulcer, unspecified as acute or chronic, without hemorrhage or perforation: Secondary | ICD-10-CM | POA: Diagnosis not present

## 2018-05-18 DIAGNOSIS — K219 Gastro-esophageal reflux disease without esophagitis: Secondary | ICD-10-CM | POA: Diagnosis present

## 2018-05-18 DIAGNOSIS — K208 Other esophagitis: Secondary | ICD-10-CM | POA: Diagnosis not present

## 2018-05-18 DIAGNOSIS — T68XXXA Hypothermia, initial encounter: Secondary | ICD-10-CM | POA: Diagnosis not present

## 2018-05-18 DIAGNOSIS — I214 Non-ST elevation (NSTEMI) myocardial infarction: Secondary | ICD-10-CM | POA: Diagnosis present

## 2018-05-18 DIAGNOSIS — E119 Type 2 diabetes mellitus without complications: Secondary | ICD-10-CM | POA: Diagnosis not present

## 2018-05-18 DIAGNOSIS — R410 Disorientation, unspecified: Secondary | ICD-10-CM | POA: Diagnosis not present

## 2018-05-18 DIAGNOSIS — I130 Hypertensive heart and chronic kidney disease with heart failure and stage 1 through stage 4 chronic kidney disease, or unspecified chronic kidney disease: Secondary | ICD-10-CM | POA: Diagnosis present

## 2018-05-18 DIAGNOSIS — R601 Generalized edema: Secondary | ICD-10-CM | POA: Diagnosis not present

## 2018-05-18 DIAGNOSIS — E8809 Other disorders of plasma-protein metabolism, not elsewhere classified: Secondary | ICD-10-CM | POA: Diagnosis not present

## 2018-05-18 DIAGNOSIS — E8770 Fluid overload, unspecified: Secondary | ICD-10-CM | POA: Diagnosis not present

## 2018-05-18 DIAGNOSIS — D62 Acute posthemorrhagic anemia: Secondary | ICD-10-CM | POA: Diagnosis present

## 2018-05-18 DIAGNOSIS — G9341 Metabolic encephalopathy: Secondary | ICD-10-CM | POA: Diagnosis not present

## 2018-05-18 DIAGNOSIS — D649 Anemia, unspecified: Secondary | ICD-10-CM | POA: Diagnosis not present

## 2018-05-18 DIAGNOSIS — N183 Chronic kidney disease, stage 3 (moderate): Secondary | ICD-10-CM | POA: Diagnosis not present

## 2018-05-18 DIAGNOSIS — R5381 Other malaise: Secondary | ICD-10-CM | POA: Diagnosis present

## 2018-05-18 DIAGNOSIS — I2510 Atherosclerotic heart disease of native coronary artery without angina pectoris: Secondary | ICD-10-CM | POA: Diagnosis present

## 2018-05-18 DIAGNOSIS — N189 Chronic kidney disease, unspecified: Secondary | ICD-10-CM | POA: Diagnosis not present

## 2018-05-18 DIAGNOSIS — Z794 Long term (current) use of insulin: Secondary | ICD-10-CM | POA: Diagnosis not present

## 2018-05-18 DIAGNOSIS — K264 Chronic or unspecified duodenal ulcer with hemorrhage: Secondary | ICD-10-CM | POA: Diagnosis present

## 2018-05-18 DIAGNOSIS — I959 Hypotension, unspecified: Secondary | ICD-10-CM | POA: Diagnosis not present

## 2018-05-18 DIAGNOSIS — Z7902 Long term (current) use of antithrombotics/antiplatelets: Secondary | ICD-10-CM | POA: Diagnosis not present

## 2018-05-18 DIAGNOSIS — K209 Esophagitis, unspecified: Secondary | ICD-10-CM | POA: Diagnosis not present

## 2018-05-18 DIAGNOSIS — M2550 Pain in unspecified joint: Secondary | ICD-10-CM | POA: Diagnosis not present

## 2018-05-18 DIAGNOSIS — I119 Hypertensive heart disease without heart failure: Secondary | ICD-10-CM | POA: Diagnosis not present

## 2018-05-18 DIAGNOSIS — E1165 Type 2 diabetes mellitus with hyperglycemia: Secondary | ICD-10-CM | POA: Diagnosis present

## 2018-05-18 DIAGNOSIS — E872 Acidosis: Secondary | ICD-10-CM | POA: Diagnosis not present

## 2018-05-18 DIAGNOSIS — K2210 Ulcer of esophagus without bleeding: Secondary | ICD-10-CM | POA: Diagnosis not present

## 2018-05-18 DIAGNOSIS — R4781 Slurred speech: Secondary | ICD-10-CM | POA: Diagnosis not present

## 2018-05-19 DIAGNOSIS — E872 Acidosis: Secondary | ICD-10-CM | POA: Diagnosis not present

## 2018-05-19 DIAGNOSIS — N179 Acute kidney failure, unspecified: Secondary | ICD-10-CM | POA: Diagnosis not present

## 2018-05-19 DIAGNOSIS — D649 Anemia, unspecified: Secondary | ICD-10-CM

## 2018-05-19 DIAGNOSIS — I5022 Chronic systolic (congestive) heart failure: Secondary | ICD-10-CM | POA: Diagnosis not present

## 2018-05-19 DIAGNOSIS — I959 Hypotension, unspecified: Secondary | ICD-10-CM | POA: Diagnosis not present

## 2018-05-19 DIAGNOSIS — I119 Hypertensive heart disease without heart failure: Secondary | ICD-10-CM | POA: Diagnosis not present

## 2018-05-19 DIAGNOSIS — N183 Chronic kidney disease, stage 3 (moderate): Secondary | ICD-10-CM

## 2018-05-19 DIAGNOSIS — I2510 Atherosclerotic heart disease of native coronary artery without angina pectoris: Secondary | ICD-10-CM | POA: Diagnosis not present

## 2018-05-20 DIAGNOSIS — E872 Acidosis: Secondary | ICD-10-CM | POA: Diagnosis not present

## 2018-05-20 DIAGNOSIS — D649 Anemia, unspecified: Secondary | ICD-10-CM | POA: Diagnosis not present

## 2018-05-20 DIAGNOSIS — I2510 Atherosclerotic heart disease of native coronary artery without angina pectoris: Secondary | ICD-10-CM | POA: Diagnosis not present

## 2018-05-20 DIAGNOSIS — I5022 Chronic systolic (congestive) heart failure: Secondary | ICD-10-CM | POA: Diagnosis not present

## 2018-05-20 DIAGNOSIS — I959 Hypotension, unspecified: Secondary | ICD-10-CM | POA: Diagnosis not present

## 2018-05-20 DIAGNOSIS — I119 Hypertensive heart disease without heart failure: Secondary | ICD-10-CM | POA: Diagnosis not present

## 2018-05-21 DIAGNOSIS — I2510 Atherosclerotic heart disease of native coronary artery without angina pectoris: Secondary | ICD-10-CM | POA: Diagnosis not present

## 2018-05-21 DIAGNOSIS — N179 Acute kidney failure, unspecified: Secondary | ICD-10-CM | POA: Diagnosis not present

## 2018-05-21 DIAGNOSIS — I119 Hypertensive heart disease without heart failure: Secondary | ICD-10-CM | POA: Diagnosis not present

## 2018-05-21 DIAGNOSIS — E872 Acidosis: Secondary | ICD-10-CM | POA: Diagnosis not present

## 2018-05-21 DIAGNOSIS — I5022 Chronic systolic (congestive) heart failure: Secondary | ICD-10-CM | POA: Diagnosis not present

## 2018-05-21 DIAGNOSIS — I959 Hypotension, unspecified: Secondary | ICD-10-CM | POA: Diagnosis not present

## 2018-05-21 DIAGNOSIS — D649 Anemia, unspecified: Secondary | ICD-10-CM | POA: Diagnosis not present

## 2018-05-22 DIAGNOSIS — I959 Hypotension, unspecified: Secondary | ICD-10-CM | POA: Diagnosis not present

## 2018-05-22 DIAGNOSIS — I2510 Atherosclerotic heart disease of native coronary artery without angina pectoris: Secondary | ICD-10-CM | POA: Diagnosis not present

## 2018-05-22 DIAGNOSIS — E872 Acidosis: Secondary | ICD-10-CM | POA: Diagnosis not present

## 2018-05-22 DIAGNOSIS — N179 Acute kidney failure, unspecified: Secondary | ICD-10-CM | POA: Diagnosis not present

## 2018-05-22 DIAGNOSIS — I5022 Chronic systolic (congestive) heart failure: Secondary | ICD-10-CM | POA: Diagnosis not present

## 2018-05-22 DIAGNOSIS — D649 Anemia, unspecified: Secondary | ICD-10-CM | POA: Diagnosis not present

## 2018-05-22 DIAGNOSIS — I119 Hypertensive heart disease without heart failure: Secondary | ICD-10-CM | POA: Diagnosis not present

## 2018-05-23 DIAGNOSIS — I2510 Atherosclerotic heart disease of native coronary artery without angina pectoris: Secondary | ICD-10-CM | POA: Diagnosis not present

## 2018-05-23 DIAGNOSIS — E872 Acidosis: Secondary | ICD-10-CM | POA: Diagnosis not present

## 2018-05-23 DIAGNOSIS — I119 Hypertensive heart disease without heart failure: Secondary | ICD-10-CM | POA: Diagnosis not present

## 2018-05-23 DIAGNOSIS — I5022 Chronic systolic (congestive) heart failure: Secondary | ICD-10-CM | POA: Diagnosis not present

## 2018-05-23 DIAGNOSIS — I959 Hypotension, unspecified: Secondary | ICD-10-CM | POA: Diagnosis not present

## 2018-05-23 DIAGNOSIS — I4891 Unspecified atrial fibrillation: Secondary | ICD-10-CM

## 2018-05-24 DIAGNOSIS — I959 Hypotension, unspecified: Secondary | ICD-10-CM | POA: Diagnosis not present

## 2018-05-24 DIAGNOSIS — K922 Gastrointestinal hemorrhage, unspecified: Secondary | ICD-10-CM

## 2018-05-24 DIAGNOSIS — I119 Hypertensive heart disease without heart failure: Secondary | ICD-10-CM | POA: Diagnosis not present

## 2018-05-24 DIAGNOSIS — E872 Acidosis: Secondary | ICD-10-CM | POA: Diagnosis not present

## 2018-05-24 DIAGNOSIS — I4891 Unspecified atrial fibrillation: Secondary | ICD-10-CM | POA: Diagnosis not present

## 2018-05-24 DIAGNOSIS — I2510 Atherosclerotic heart disease of native coronary artery without angina pectoris: Secondary | ICD-10-CM | POA: Diagnosis not present

## 2018-05-25 DIAGNOSIS — I4891 Unspecified atrial fibrillation: Secondary | ICD-10-CM | POA: Diagnosis not present

## 2018-05-25 DIAGNOSIS — I2510 Atherosclerotic heart disease of native coronary artery without angina pectoris: Secondary | ICD-10-CM | POA: Diagnosis not present

## 2018-05-25 DIAGNOSIS — K922 Gastrointestinal hemorrhage, unspecified: Secondary | ICD-10-CM | POA: Diagnosis not present

## 2018-05-25 DIAGNOSIS — I119 Hypertensive heart disease without heart failure: Secondary | ICD-10-CM | POA: Diagnosis not present

## 2018-05-25 DIAGNOSIS — E872 Acidosis: Secondary | ICD-10-CM | POA: Diagnosis not present

## 2018-05-25 DIAGNOSIS — I959 Hypotension, unspecified: Secondary | ICD-10-CM | POA: Diagnosis not present

## 2018-05-26 DIAGNOSIS — I4891 Unspecified atrial fibrillation: Secondary | ICD-10-CM

## 2018-05-26 DIAGNOSIS — I959 Hypotension, unspecified: Secondary | ICD-10-CM | POA: Diagnosis not present

## 2018-05-26 DIAGNOSIS — E872 Acidosis: Secondary | ICD-10-CM | POA: Diagnosis not present

## 2018-05-26 DIAGNOSIS — I2510 Atherosclerotic heart disease of native coronary artery without angina pectoris: Secondary | ICD-10-CM

## 2018-05-26 DIAGNOSIS — I119 Hypertensive heart disease without heart failure: Secondary | ICD-10-CM | POA: Diagnosis not present

## 2018-05-27 DIAGNOSIS — E872 Acidosis: Secondary | ICD-10-CM | POA: Diagnosis not present

## 2018-05-27 DIAGNOSIS — I959 Hypotension, unspecified: Secondary | ICD-10-CM | POA: Diagnosis not present

## 2018-05-27 DIAGNOSIS — I2510 Atherosclerotic heart disease of native coronary artery without angina pectoris: Secondary | ICD-10-CM | POA: Diagnosis not present

## 2018-05-27 DIAGNOSIS — I119 Hypertensive heart disease without heart failure: Secondary | ICD-10-CM | POA: Diagnosis not present

## 2018-05-27 DIAGNOSIS — I4891 Unspecified atrial fibrillation: Secondary | ICD-10-CM | POA: Diagnosis not present

## 2018-05-27 DIAGNOSIS — E8770 Fluid overload, unspecified: Secondary | ICD-10-CM

## 2018-05-28 DIAGNOSIS — R1310 Dysphagia, unspecified: Secondary | ICD-10-CM | POA: Diagnosis not present

## 2018-05-28 DIAGNOSIS — E119 Type 2 diabetes mellitus without complications: Secondary | ICD-10-CM | POA: Diagnosis not present

## 2018-05-28 DIAGNOSIS — F0390 Unspecified dementia without behavioral disturbance: Secondary | ICD-10-CM | POA: Diagnosis not present

## 2018-05-28 DIAGNOSIS — I119 Hypertensive heart disease without heart failure: Secondary | ICD-10-CM | POA: Diagnosis not present

## 2018-05-28 DIAGNOSIS — T68XXXA Hypothermia, initial encounter: Secondary | ICD-10-CM | POA: Diagnosis not present

## 2018-05-28 DIAGNOSIS — F4321 Adjustment disorder with depressed mood: Secondary | ICD-10-CM | POA: Diagnosis not present

## 2018-05-28 DIAGNOSIS — E8809 Other disorders of plasma-protein metabolism, not elsewhere classified: Secondary | ICD-10-CM | POA: Diagnosis not present

## 2018-05-28 DIAGNOSIS — K2990 Gastroduodenitis, unspecified, without bleeding: Secondary | ICD-10-CM | POA: Diagnosis not present

## 2018-05-28 DIAGNOSIS — F4320 Adjustment disorder, unspecified: Secondary | ICD-10-CM | POA: Diagnosis not present

## 2018-05-28 DIAGNOSIS — F0280 Dementia in other diseases classified elsewhere without behavioral disturbance: Secondary | ICD-10-CM | POA: Diagnosis not present

## 2018-05-28 DIAGNOSIS — R5381 Other malaise: Secondary | ICD-10-CM | POA: Diagnosis not present

## 2018-05-28 DIAGNOSIS — I5022 Chronic systolic (congestive) heart failure: Secondary | ICD-10-CM | POA: Diagnosis not present

## 2018-05-28 DIAGNOSIS — E46 Unspecified protein-calorie malnutrition: Secondary | ICD-10-CM | POA: Diagnosis not present

## 2018-05-28 DIAGNOSIS — I959 Hypotension, unspecified: Secondary | ICD-10-CM | POA: Diagnosis not present

## 2018-05-28 DIAGNOSIS — R110 Nausea: Secondary | ICD-10-CM | POA: Diagnosis not present

## 2018-05-28 DIAGNOSIS — I480 Paroxysmal atrial fibrillation: Secondary | ICD-10-CM | POA: Diagnosis not present

## 2018-05-28 DIAGNOSIS — I214 Non-ST elevation (NSTEMI) myocardial infarction: Secondary | ICD-10-CM | POA: Diagnosis not present

## 2018-05-28 DIAGNOSIS — L299 Pruritus, unspecified: Secondary | ICD-10-CM | POA: Diagnosis not present

## 2018-05-28 DIAGNOSIS — K209 Esophagitis, unspecified: Secondary | ICD-10-CM | POA: Diagnosis not present

## 2018-05-28 DIAGNOSIS — K2970 Gastritis, unspecified, without bleeding: Secondary | ICD-10-CM | POA: Diagnosis not present

## 2018-05-28 DIAGNOSIS — G9341 Metabolic encephalopathy: Secondary | ICD-10-CM | POA: Diagnosis not present

## 2018-05-28 DIAGNOSIS — R627 Adult failure to thrive: Secondary | ICD-10-CM | POA: Diagnosis not present

## 2018-05-28 DIAGNOSIS — R410 Disorientation, unspecified: Secondary | ICD-10-CM | POA: Diagnosis not present

## 2018-05-28 DIAGNOSIS — K269 Duodenal ulcer, unspecified as acute or chronic, without hemorrhage or perforation: Secondary | ICD-10-CM | POA: Diagnosis not present

## 2018-05-28 DIAGNOSIS — E8770 Fluid overload, unspecified: Secondary | ICD-10-CM | POA: Diagnosis not present

## 2018-05-28 DIAGNOSIS — E872 Acidosis: Secondary | ICD-10-CM | POA: Diagnosis not present

## 2018-05-28 DIAGNOSIS — M2550 Pain in unspecified joint: Secondary | ICD-10-CM | POA: Diagnosis not present

## 2018-05-28 DIAGNOSIS — I213 ST elevation (STEMI) myocardial infarction of unspecified site: Secondary | ICD-10-CM | POA: Diagnosis not present

## 2018-05-28 DIAGNOSIS — K922 Gastrointestinal hemorrhage, unspecified: Secondary | ICD-10-CM | POA: Diagnosis not present

## 2018-05-28 DIAGNOSIS — I10 Essential (primary) hypertension: Secondary | ICD-10-CM | POA: Diagnosis not present

## 2018-05-28 DIAGNOSIS — Z7401 Bed confinement status: Secondary | ICD-10-CM | POA: Diagnosis not present

## 2018-05-28 DIAGNOSIS — R51 Headache: Secondary | ICD-10-CM | POA: Diagnosis not present

## 2018-05-28 DIAGNOSIS — Z8673 Personal history of transient ischemic attack (TIA), and cerebral infarction without residual deficits: Secondary | ICD-10-CM | POA: Diagnosis not present

## 2018-05-28 DIAGNOSIS — N189 Chronic kidney disease, unspecified: Secondary | ICD-10-CM | POA: Diagnosis not present

## 2018-05-28 DIAGNOSIS — I4891 Unspecified atrial fibrillation: Secondary | ICD-10-CM | POA: Diagnosis not present

## 2018-05-28 DIAGNOSIS — R54 Age-related physical debility: Secondary | ICD-10-CM | POA: Diagnosis not present

## 2018-05-28 DIAGNOSIS — D649 Anemia, unspecified: Secondary | ICD-10-CM | POA: Diagnosis not present

## 2018-05-28 DIAGNOSIS — E6609 Other obesity due to excess calories: Secondary | ICD-10-CM | POA: Diagnosis not present

## 2018-05-28 DIAGNOSIS — R601 Generalized edema: Secondary | ICD-10-CM | POA: Diagnosis not present

## 2018-05-28 DIAGNOSIS — D500 Iron deficiency anemia secondary to blood loss (chronic): Secondary | ICD-10-CM | POA: Diagnosis not present

## 2018-05-28 DIAGNOSIS — N183 Chronic kidney disease, stage 3 (moderate): Secondary | ICD-10-CM | POA: Diagnosis not present

## 2018-05-28 DIAGNOSIS — E1165 Type 2 diabetes mellitus with hyperglycemia: Secondary | ICD-10-CM | POA: Diagnosis not present

## 2018-05-28 DIAGNOSIS — I2510 Atherosclerotic heart disease of native coronary artery without angina pectoris: Secondary | ICD-10-CM | POA: Diagnosis not present

## 2018-05-29 DIAGNOSIS — K922 Gastrointestinal hemorrhage, unspecified: Secondary | ICD-10-CM | POA: Diagnosis not present

## 2018-05-29 DIAGNOSIS — R5381 Other malaise: Secondary | ICD-10-CM | POA: Diagnosis not present

## 2018-05-29 DIAGNOSIS — I2510 Atherosclerotic heart disease of native coronary artery without angina pectoris: Secondary | ICD-10-CM | POA: Diagnosis not present

## 2018-05-29 DIAGNOSIS — D500 Iron deficiency anemia secondary to blood loss (chronic): Secondary | ICD-10-CM | POA: Diagnosis not present

## 2018-06-02 DIAGNOSIS — L299 Pruritus, unspecified: Secondary | ICD-10-CM | POA: Diagnosis not present

## 2018-06-03 DIAGNOSIS — R627 Adult failure to thrive: Secondary | ICD-10-CM | POA: Diagnosis not present

## 2018-06-03 DIAGNOSIS — R1310 Dysphagia, unspecified: Secondary | ICD-10-CM | POA: Diagnosis not present

## 2018-06-10 DIAGNOSIS — K2970 Gastritis, unspecified, without bleeding: Secondary | ICD-10-CM | POA: Diagnosis not present

## 2018-06-10 DIAGNOSIS — R110 Nausea: Secondary | ICD-10-CM | POA: Diagnosis not present

## 2018-06-10 DIAGNOSIS — R627 Adult failure to thrive: Secondary | ICD-10-CM | POA: Diagnosis not present

## 2018-06-10 DIAGNOSIS — R1310 Dysphagia, unspecified: Secondary | ICD-10-CM | POA: Diagnosis not present

## 2018-06-16 DIAGNOSIS — R51 Headache: Secondary | ICD-10-CM | POA: Diagnosis not present

## 2018-06-16 DIAGNOSIS — I2510 Atherosclerotic heart disease of native coronary artery without angina pectoris: Secondary | ICD-10-CM | POA: Diagnosis not present

## 2018-06-16 DIAGNOSIS — I10 Essential (primary) hypertension: Secondary | ICD-10-CM | POA: Diagnosis not present

## 2018-06-16 DIAGNOSIS — F0390 Unspecified dementia without behavioral disturbance: Secondary | ICD-10-CM | POA: Diagnosis not present

## 2018-06-24 DIAGNOSIS — F0390 Unspecified dementia without behavioral disturbance: Secondary | ICD-10-CM | POA: Diagnosis not present

## 2018-06-24 DIAGNOSIS — I10 Essential (primary) hypertension: Secondary | ICD-10-CM | POA: Diagnosis not present

## 2018-06-24 DIAGNOSIS — F4321 Adjustment disorder with depressed mood: Secondary | ICD-10-CM | POA: Diagnosis not present

## 2018-06-24 DIAGNOSIS — N189 Chronic kidney disease, unspecified: Secondary | ICD-10-CM | POA: Diagnosis not present

## 2018-06-24 DIAGNOSIS — R51 Headache: Secondary | ICD-10-CM | POA: Diagnosis not present

## 2018-07-01 DIAGNOSIS — F4321 Adjustment disorder with depressed mood: Secondary | ICD-10-CM | POA: Diagnosis not present

## 2018-07-01 DIAGNOSIS — I10 Essential (primary) hypertension: Secondary | ICD-10-CM | POA: Diagnosis not present

## 2018-07-01 DIAGNOSIS — F0390 Unspecified dementia without behavioral disturbance: Secondary | ICD-10-CM | POA: Diagnosis not present

## 2018-07-01 DIAGNOSIS — E6609 Other obesity due to excess calories: Secondary | ICD-10-CM | POA: Diagnosis not present

## 2018-07-01 DIAGNOSIS — I2510 Atherosclerotic heart disease of native coronary artery without angina pectoris: Secondary | ICD-10-CM | POA: Diagnosis not present

## 2018-07-08 DIAGNOSIS — F4320 Adjustment disorder, unspecified: Secondary | ICD-10-CM | POA: Diagnosis not present

## 2018-07-08 DIAGNOSIS — F0150 Vascular dementia without behavioral disturbance: Secondary | ICD-10-CM | POA: Diagnosis not present

## 2018-07-16 DIAGNOSIS — F0390 Unspecified dementia without behavioral disturbance: Secondary | ICD-10-CM | POA: Diagnosis not present

## 2018-07-16 DIAGNOSIS — F4321 Adjustment disorder with depressed mood: Secondary | ICD-10-CM | POA: Diagnosis not present

## 2018-07-31 DIAGNOSIS — F4321 Adjustment disorder with depressed mood: Secondary | ICD-10-CM | POA: Diagnosis not present

## 2018-07-31 DIAGNOSIS — F0390 Unspecified dementia without behavioral disturbance: Secondary | ICD-10-CM | POA: Diagnosis not present

## 2018-08-06 DIAGNOSIS — R1311 Dysphagia, oral phase: Secondary | ICD-10-CM | POA: Diagnosis not present

## 2018-08-06 DIAGNOSIS — R1310 Dysphagia, unspecified: Secondary | ICD-10-CM | POA: Diagnosis not present

## 2018-08-07 DIAGNOSIS — F0390 Unspecified dementia without behavioral disturbance: Secondary | ICD-10-CM | POA: Diagnosis not present

## 2018-08-07 DIAGNOSIS — K922 Gastrointestinal hemorrhage, unspecified: Secondary | ICD-10-CM | POA: Diagnosis not present

## 2018-08-07 DIAGNOSIS — D500 Iron deficiency anemia secondary to blood loss (chronic): Secondary | ICD-10-CM | POA: Diagnosis not present

## 2018-08-07 DIAGNOSIS — R1310 Dysphagia, unspecified: Secondary | ICD-10-CM | POA: Diagnosis not present

## 2018-08-07 DIAGNOSIS — R1311 Dysphagia, oral phase: Secondary | ICD-10-CM | POA: Diagnosis not present

## 2018-08-07 DIAGNOSIS — I2510 Atherosclerotic heart disease of native coronary artery without angina pectoris: Secondary | ICD-10-CM | POA: Diagnosis not present

## 2018-08-08 DIAGNOSIS — R1311 Dysphagia, oral phase: Secondary | ICD-10-CM | POA: Diagnosis not present

## 2018-08-08 DIAGNOSIS — R1310 Dysphagia, unspecified: Secondary | ICD-10-CM | POA: Diagnosis not present

## 2018-08-11 DIAGNOSIS — R1310 Dysphagia, unspecified: Secondary | ICD-10-CM | POA: Diagnosis not present

## 2018-08-11 DIAGNOSIS — R1311 Dysphagia, oral phase: Secondary | ICD-10-CM | POA: Diagnosis not present

## 2018-08-12 DIAGNOSIS — F0150 Vascular dementia without behavioral disturbance: Secondary | ICD-10-CM | POA: Diagnosis not present

## 2018-08-12 DIAGNOSIS — F4320 Adjustment disorder, unspecified: Secondary | ICD-10-CM | POA: Diagnosis not present

## 2018-08-12 DIAGNOSIS — R1311 Dysphagia, oral phase: Secondary | ICD-10-CM | POA: Diagnosis not present

## 2018-08-12 DIAGNOSIS — R1310 Dysphagia, unspecified: Secondary | ICD-10-CM | POA: Diagnosis not present

## 2018-08-13 DIAGNOSIS — R1310 Dysphagia, unspecified: Secondary | ICD-10-CM | POA: Diagnosis not present

## 2018-08-13 DIAGNOSIS — R1311 Dysphagia, oral phase: Secondary | ICD-10-CM | POA: Diagnosis not present

## 2018-08-18 DIAGNOSIS — K2970 Gastritis, unspecified, without bleeding: Secondary | ICD-10-CM | POA: Diagnosis not present

## 2018-08-18 DIAGNOSIS — K269 Duodenal ulcer, unspecified as acute or chronic, without hemorrhage or perforation: Secondary | ICD-10-CM | POA: Diagnosis not present

## 2018-08-18 DIAGNOSIS — K219 Gastro-esophageal reflux disease without esophagitis: Secondary | ICD-10-CM | POA: Diagnosis not present

## 2018-08-18 DIAGNOSIS — R1310 Dysphagia, unspecified: Secondary | ICD-10-CM | POA: Diagnosis not present

## 2018-08-18 DIAGNOSIS — R1311 Dysphagia, oral phase: Secondary | ICD-10-CM | POA: Diagnosis not present

## 2018-08-19 DIAGNOSIS — R1310 Dysphagia, unspecified: Secondary | ICD-10-CM | POA: Diagnosis not present

## 2018-08-19 DIAGNOSIS — R1311 Dysphagia, oral phase: Secondary | ICD-10-CM | POA: Diagnosis not present

## 2018-08-20 DIAGNOSIS — R1311 Dysphagia, oral phase: Secondary | ICD-10-CM | POA: Diagnosis not present

## 2018-08-20 DIAGNOSIS — R1310 Dysphagia, unspecified: Secondary | ICD-10-CM | POA: Diagnosis not present

## 2018-09-07 DIAGNOSIS — E6609 Other obesity due to excess calories: Secondary | ICD-10-CM | POA: Diagnosis not present

## 2018-09-07 DIAGNOSIS — I2510 Atherosclerotic heart disease of native coronary artery without angina pectoris: Secondary | ICD-10-CM | POA: Diagnosis not present

## 2018-09-07 DIAGNOSIS — F0390 Unspecified dementia without behavioral disturbance: Secondary | ICD-10-CM | POA: Diagnosis not present

## 2018-09-07 DIAGNOSIS — I10 Essential (primary) hypertension: Secondary | ICD-10-CM | POA: Diagnosis not present

## 2018-09-09 DIAGNOSIS — F4320 Adjustment disorder, unspecified: Secondary | ICD-10-CM | POA: Diagnosis not present

## 2018-09-09 DIAGNOSIS — F0150 Vascular dementia without behavioral disturbance: Secondary | ICD-10-CM | POA: Diagnosis not present

## 2018-09-09 DIAGNOSIS — R634 Abnormal weight loss: Secondary | ICD-10-CM | POA: Diagnosis not present

## 2018-09-23 DIAGNOSIS — Z20828 Contact with and (suspected) exposure to other viral communicable diseases: Secondary | ICD-10-CM | POA: Diagnosis not present

## 2018-10-09 DIAGNOSIS — I2510 Atherosclerotic heart disease of native coronary artery without angina pectoris: Secondary | ICD-10-CM | POA: Diagnosis not present

## 2018-10-09 DIAGNOSIS — F0390 Unspecified dementia without behavioral disturbance: Secondary | ICD-10-CM | POA: Diagnosis not present

## 2018-10-09 DIAGNOSIS — K922 Gastrointestinal hemorrhage, unspecified: Secondary | ICD-10-CM | POA: Diagnosis not present

## 2018-10-09 DIAGNOSIS — E6609 Other obesity due to excess calories: Secondary | ICD-10-CM | POA: Diagnosis not present

## 2018-10-16 DIAGNOSIS — F0150 Vascular dementia without behavioral disturbance: Secondary | ICD-10-CM | POA: Diagnosis not present

## 2018-10-16 DIAGNOSIS — F4320 Adjustment disorder, unspecified: Secondary | ICD-10-CM | POA: Diagnosis not present

## 2018-10-16 DIAGNOSIS — R634 Abnormal weight loss: Secondary | ICD-10-CM | POA: Diagnosis not present

## 2018-10-21 DIAGNOSIS — E1165 Type 2 diabetes mellitus with hyperglycemia: Secondary | ICD-10-CM | POA: Diagnosis not present

## 2018-10-29 DIAGNOSIS — L603 Nail dystrophy: Secondary | ICD-10-CM | POA: Diagnosis not present

## 2018-10-29 DIAGNOSIS — Q845 Enlarged and hypertrophic nails: Secondary | ICD-10-CM | POA: Diagnosis not present

## 2018-10-29 DIAGNOSIS — B351 Tinea unguium: Secondary | ICD-10-CM | POA: Diagnosis not present

## 2018-10-29 DIAGNOSIS — I7091 Generalized atherosclerosis: Secondary | ICD-10-CM | POA: Diagnosis not present

## 2018-10-29 DIAGNOSIS — I739 Peripheral vascular disease, unspecified: Secondary | ICD-10-CM | POA: Diagnosis not present

## 2018-10-29 DIAGNOSIS — E1159 Type 2 diabetes mellitus with other circulatory complications: Secondary | ICD-10-CM | POA: Diagnosis not present

## 2018-10-30 DIAGNOSIS — F4320 Adjustment disorder, unspecified: Secondary | ICD-10-CM | POA: Diagnosis not present

## 2018-10-30 DIAGNOSIS — R634 Abnormal weight loss: Secondary | ICD-10-CM | POA: Diagnosis not present

## 2018-10-30 DIAGNOSIS — F0150 Vascular dementia without behavioral disturbance: Secondary | ICD-10-CM | POA: Diagnosis not present

## 2018-11-04 DIAGNOSIS — B342 Coronavirus infection, unspecified: Secondary | ICD-10-CM | POA: Diagnosis not present

## 2018-11-07 DIAGNOSIS — F4320 Adjustment disorder, unspecified: Secondary | ICD-10-CM | POA: Diagnosis not present

## 2018-11-07 DIAGNOSIS — F0150 Vascular dementia without behavioral disturbance: Secondary | ICD-10-CM | POA: Diagnosis not present

## 2018-11-07 DIAGNOSIS — R634 Abnormal weight loss: Secondary | ICD-10-CM | POA: Diagnosis not present

## 2018-11-21 DIAGNOSIS — Z20828 Contact with and (suspected) exposure to other viral communicable diseases: Secondary | ICD-10-CM | POA: Diagnosis not present

## 2018-11-27 DIAGNOSIS — F339 Major depressive disorder, recurrent, unspecified: Secondary | ICD-10-CM | POA: Diagnosis not present

## 2018-11-27 DIAGNOSIS — R634 Abnormal weight loss: Secondary | ICD-10-CM | POA: Diagnosis not present

## 2018-11-27 DIAGNOSIS — G4700 Insomnia, unspecified: Secondary | ICD-10-CM | POA: Diagnosis not present

## 2018-11-27 DIAGNOSIS — F0150 Vascular dementia without behavioral disturbance: Secondary | ICD-10-CM | POA: Diagnosis not present

## 2018-12-01 DIAGNOSIS — Z20828 Contact with and (suspected) exposure to other viral communicable diseases: Secondary | ICD-10-CM | POA: Diagnosis not present

## 2018-12-10 DIAGNOSIS — Z20828 Contact with and (suspected) exposure to other viral communicable diseases: Secondary | ICD-10-CM | POA: Diagnosis not present

## 2018-12-11 DIAGNOSIS — E119 Type 2 diabetes mellitus without complications: Secondary | ICD-10-CM | POA: Diagnosis not present

## 2018-12-11 DIAGNOSIS — E6609 Other obesity due to excess calories: Secondary | ICD-10-CM | POA: Diagnosis not present

## 2018-12-11 DIAGNOSIS — F0390 Unspecified dementia without behavioral disturbance: Secondary | ICD-10-CM | POA: Diagnosis not present

## 2018-12-11 DIAGNOSIS — D500 Iron deficiency anemia secondary to blood loss (chronic): Secondary | ICD-10-CM | POA: Diagnosis not present

## 2018-12-19 DIAGNOSIS — E1165 Type 2 diabetes mellitus with hyperglycemia: Secondary | ICD-10-CM | POA: Diagnosis not present

## 2018-12-19 DIAGNOSIS — D649 Anemia, unspecified: Secondary | ICD-10-CM | POA: Diagnosis not present

## 2018-12-19 DIAGNOSIS — E7849 Other hyperlipidemia: Secondary | ICD-10-CM | POA: Diagnosis not present

## 2018-12-19 DIAGNOSIS — I10 Essential (primary) hypertension: Secondary | ICD-10-CM | POA: Diagnosis not present

## 2018-12-23 DIAGNOSIS — R2681 Unsteadiness on feet: Secondary | ICD-10-CM | POA: Diagnosis not present

## 2018-12-23 DIAGNOSIS — F0390 Unspecified dementia without behavioral disturbance: Secondary | ICD-10-CM | POA: Diagnosis not present

## 2018-12-23 DIAGNOSIS — M6281 Muscle weakness (generalized): Secondary | ICD-10-CM | POA: Diagnosis not present

## 2018-12-23 DIAGNOSIS — R531 Weakness: Secondary | ICD-10-CM | POA: Diagnosis not present

## 2018-12-23 DIAGNOSIS — R296 Repeated falls: Secondary | ICD-10-CM | POA: Diagnosis not present

## 2018-12-23 DIAGNOSIS — Z741 Need for assistance with personal care: Secondary | ICD-10-CM | POA: Diagnosis not present

## 2018-12-23 DIAGNOSIS — F0150 Vascular dementia without behavioral disturbance: Secondary | ICD-10-CM | POA: Diagnosis not present

## 2018-12-24 DIAGNOSIS — M6281 Muscle weakness (generalized): Secondary | ICD-10-CM | POA: Diagnosis not present

## 2018-12-24 DIAGNOSIS — Z20828 Contact with and (suspected) exposure to other viral communicable diseases: Secondary | ICD-10-CM | POA: Diagnosis not present

## 2018-12-24 DIAGNOSIS — Z741 Need for assistance with personal care: Secondary | ICD-10-CM | POA: Diagnosis not present

## 2018-12-24 DIAGNOSIS — R2681 Unsteadiness on feet: Secondary | ICD-10-CM | POA: Diagnosis not present

## 2018-12-24 DIAGNOSIS — F0150 Vascular dementia without behavioral disturbance: Secondary | ICD-10-CM | POA: Diagnosis not present

## 2018-12-25 DIAGNOSIS — F0150 Vascular dementia without behavioral disturbance: Secondary | ICD-10-CM | POA: Diagnosis not present

## 2018-12-25 DIAGNOSIS — M6281 Muscle weakness (generalized): Secondary | ICD-10-CM | POA: Diagnosis not present

## 2018-12-25 DIAGNOSIS — R2681 Unsteadiness on feet: Secondary | ICD-10-CM | POA: Diagnosis not present

## 2018-12-25 DIAGNOSIS — Z741 Need for assistance with personal care: Secondary | ICD-10-CM | POA: Diagnosis not present

## 2018-12-26 DIAGNOSIS — N390 Urinary tract infection, site not specified: Secondary | ICD-10-CM | POA: Diagnosis not present

## 2018-12-26 DIAGNOSIS — F0150 Vascular dementia without behavioral disturbance: Secondary | ICD-10-CM | POA: Diagnosis not present

## 2018-12-26 DIAGNOSIS — Z741 Need for assistance with personal care: Secondary | ICD-10-CM | POA: Diagnosis not present

## 2018-12-26 DIAGNOSIS — R2681 Unsteadiness on feet: Secondary | ICD-10-CM | POA: Diagnosis not present

## 2018-12-26 DIAGNOSIS — M6281 Muscle weakness (generalized): Secondary | ICD-10-CM | POA: Diagnosis not present

## 2018-12-29 DIAGNOSIS — F0150 Vascular dementia without behavioral disturbance: Secondary | ICD-10-CM | POA: Diagnosis not present

## 2018-12-29 DIAGNOSIS — M6281 Muscle weakness (generalized): Secondary | ICD-10-CM | POA: Diagnosis not present

## 2018-12-29 DIAGNOSIS — R2681 Unsteadiness on feet: Secondary | ICD-10-CM | POA: Diagnosis not present

## 2018-12-29 DIAGNOSIS — F0390 Unspecified dementia without behavioral disturbance: Secondary | ICD-10-CM | POA: Diagnosis not present

## 2018-12-29 DIAGNOSIS — R531 Weakness: Secondary | ICD-10-CM | POA: Diagnosis not present

## 2018-12-29 DIAGNOSIS — R296 Repeated falls: Secondary | ICD-10-CM | POA: Diagnosis not present

## 2018-12-29 DIAGNOSIS — Z741 Need for assistance with personal care: Secondary | ICD-10-CM | POA: Diagnosis not present

## 2018-12-30 DIAGNOSIS — Z20828 Contact with and (suspected) exposure to other viral communicable diseases: Secondary | ICD-10-CM | POA: Diagnosis not present

## 2018-12-30 DIAGNOSIS — Z741 Need for assistance with personal care: Secondary | ICD-10-CM | POA: Diagnosis not present

## 2018-12-30 DIAGNOSIS — F0150 Vascular dementia without behavioral disturbance: Secondary | ICD-10-CM | POA: Diagnosis not present

## 2018-12-30 DIAGNOSIS — R2681 Unsteadiness on feet: Secondary | ICD-10-CM | POA: Diagnosis not present

## 2018-12-30 DIAGNOSIS — M6281 Muscle weakness (generalized): Secondary | ICD-10-CM | POA: Diagnosis not present

## 2018-12-31 DIAGNOSIS — M6281 Muscle weakness (generalized): Secondary | ICD-10-CM | POA: Diagnosis not present

## 2018-12-31 DIAGNOSIS — R2681 Unsteadiness on feet: Secondary | ICD-10-CM | POA: Diagnosis not present

## 2018-12-31 DIAGNOSIS — F0150 Vascular dementia without behavioral disturbance: Secondary | ICD-10-CM | POA: Diagnosis not present

## 2018-12-31 DIAGNOSIS — Z741 Need for assistance with personal care: Secondary | ICD-10-CM | POA: Diagnosis not present

## 2019-01-01 DIAGNOSIS — R2681 Unsteadiness on feet: Secondary | ICD-10-CM | POA: Diagnosis not present

## 2019-01-01 DIAGNOSIS — Z741 Need for assistance with personal care: Secondary | ICD-10-CM | POA: Diagnosis not present

## 2019-01-01 DIAGNOSIS — M6281 Muscle weakness (generalized): Secondary | ICD-10-CM | POA: Diagnosis not present

## 2019-01-01 DIAGNOSIS — F0150 Vascular dementia without behavioral disturbance: Secondary | ICD-10-CM | POA: Diagnosis not present

## 2019-01-02 DIAGNOSIS — Z20828 Contact with and (suspected) exposure to other viral communicable diseases: Secondary | ICD-10-CM | POA: Diagnosis not present

## 2019-01-02 DIAGNOSIS — R2681 Unsteadiness on feet: Secondary | ICD-10-CM | POA: Diagnosis not present

## 2019-01-02 DIAGNOSIS — F0150 Vascular dementia without behavioral disturbance: Secondary | ICD-10-CM | POA: Diagnosis not present

## 2019-01-02 DIAGNOSIS — Z741 Need for assistance with personal care: Secondary | ICD-10-CM | POA: Diagnosis not present

## 2019-01-02 DIAGNOSIS — M6281 Muscle weakness (generalized): Secondary | ICD-10-CM | POA: Diagnosis not present

## 2019-01-05 DIAGNOSIS — M6281 Muscle weakness (generalized): Secondary | ICD-10-CM | POA: Diagnosis not present

## 2019-01-05 DIAGNOSIS — R2681 Unsteadiness on feet: Secondary | ICD-10-CM | POA: Diagnosis not present

## 2019-01-05 DIAGNOSIS — Z20828 Contact with and (suspected) exposure to other viral communicable diseases: Secondary | ICD-10-CM | POA: Diagnosis not present

## 2019-01-05 DIAGNOSIS — F0150 Vascular dementia without behavioral disturbance: Secondary | ICD-10-CM | POA: Diagnosis not present

## 2019-01-05 DIAGNOSIS — Z741 Need for assistance with personal care: Secondary | ICD-10-CM | POA: Diagnosis not present

## 2019-01-06 DIAGNOSIS — Z741 Need for assistance with personal care: Secondary | ICD-10-CM | POA: Diagnosis not present

## 2019-01-06 DIAGNOSIS — R2681 Unsteadiness on feet: Secondary | ICD-10-CM | POA: Diagnosis not present

## 2019-01-06 DIAGNOSIS — M6281 Muscle weakness (generalized): Secondary | ICD-10-CM | POA: Diagnosis not present

## 2019-01-06 DIAGNOSIS — F0150 Vascular dementia without behavioral disturbance: Secondary | ICD-10-CM | POA: Diagnosis not present

## 2019-01-07 DIAGNOSIS — Z741 Need for assistance with personal care: Secondary | ICD-10-CM | POA: Diagnosis not present

## 2019-01-07 DIAGNOSIS — F0150 Vascular dementia without behavioral disturbance: Secondary | ICD-10-CM | POA: Diagnosis not present

## 2019-01-07 DIAGNOSIS — M6281 Muscle weakness (generalized): Secondary | ICD-10-CM | POA: Diagnosis not present

## 2019-01-07 DIAGNOSIS — R2681 Unsteadiness on feet: Secondary | ICD-10-CM | POA: Diagnosis not present

## 2019-01-08 DIAGNOSIS — Z20828 Contact with and (suspected) exposure to other viral communicable diseases: Secondary | ICD-10-CM | POA: Diagnosis not present

## 2019-01-12 DIAGNOSIS — Z20828 Contact with and (suspected) exposure to other viral communicable diseases: Secondary | ICD-10-CM | POA: Diagnosis not present

## 2019-01-15 DIAGNOSIS — Z20828 Contact with and (suspected) exposure to other viral communicable diseases: Secondary | ICD-10-CM | POA: Diagnosis not present

## 2019-01-19 DIAGNOSIS — Z20828 Contact with and (suspected) exposure to other viral communicable diseases: Secondary | ICD-10-CM | POA: Diagnosis not present

## 2019-01-19 DIAGNOSIS — E46 Unspecified protein-calorie malnutrition: Secondary | ICD-10-CM | POA: Diagnosis not present

## 2019-01-19 DIAGNOSIS — E6609 Other obesity due to excess calories: Secondary | ICD-10-CM | POA: Diagnosis not present

## 2019-01-19 DIAGNOSIS — I213 ST elevation (STEMI) myocardial infarction of unspecified site: Secondary | ICD-10-CM | POA: Diagnosis not present

## 2019-01-19 DIAGNOSIS — K922 Gastrointestinal hemorrhage, unspecified: Secondary | ICD-10-CM | POA: Diagnosis not present

## 2019-01-19 DIAGNOSIS — K2990 Gastroduodenitis, unspecified, without bleeding: Secondary | ICD-10-CM | POA: Diagnosis not present

## 2019-01-19 DIAGNOSIS — K269 Duodenal ulcer, unspecified as acute or chronic, without hemorrhage or perforation: Secondary | ICD-10-CM | POA: Diagnosis not present

## 2019-01-19 DIAGNOSIS — Z23 Encounter for immunization: Secondary | ICD-10-CM | POA: Diagnosis not present

## 2019-01-19 DIAGNOSIS — R54 Age-related physical debility: Secondary | ICD-10-CM | POA: Diagnosis not present

## 2019-01-19 DIAGNOSIS — I480 Paroxysmal atrial fibrillation: Secondary | ICD-10-CM | POA: Diagnosis not present

## 2019-01-19 DIAGNOSIS — E1165 Type 2 diabetes mellitus with hyperglycemia: Secondary | ICD-10-CM | POA: Diagnosis not present

## 2019-01-19 DIAGNOSIS — I119 Hypertensive heart disease without heart failure: Secondary | ICD-10-CM | POA: Diagnosis not present

## 2019-01-19 DIAGNOSIS — I5022 Chronic systolic (congestive) heart failure: Secondary | ICD-10-CM | POA: Diagnosis not present

## 2019-01-19 DIAGNOSIS — I214 Non-ST elevation (NSTEMI) myocardial infarction: Secondary | ICD-10-CM | POA: Diagnosis not present

## 2019-01-19 DIAGNOSIS — I959 Hypotension, unspecified: Secondary | ICD-10-CM | POA: Diagnosis not present

## 2019-01-19 DIAGNOSIS — D649 Anemia, unspecified: Secondary | ICD-10-CM | POA: Diagnosis not present

## 2019-01-19 DIAGNOSIS — I4891 Unspecified atrial fibrillation: Secondary | ICD-10-CM | POA: Diagnosis not present

## 2019-01-19 DIAGNOSIS — F0280 Dementia in other diseases classified elsewhere without behavioral disturbance: Secondary | ICD-10-CM | POA: Diagnosis not present

## 2019-01-19 DIAGNOSIS — R601 Generalized edema: Secondary | ICD-10-CM | POA: Diagnosis not present

## 2019-01-19 DIAGNOSIS — E119 Type 2 diabetes mellitus without complications: Secondary | ICD-10-CM | POA: Diagnosis not present

## 2019-01-19 DIAGNOSIS — I2510 Atherosclerotic heart disease of native coronary artery without angina pectoris: Secondary | ICD-10-CM | POA: Diagnosis not present

## 2019-01-19 DIAGNOSIS — I10 Essential (primary) hypertension: Secondary | ICD-10-CM | POA: Diagnosis not present

## 2019-01-19 DIAGNOSIS — E8809 Other disorders of plasma-protein metabolism, not elsewhere classified: Secondary | ICD-10-CM | POA: Diagnosis not present

## 2019-01-19 DIAGNOSIS — E872 Acidosis: Secondary | ICD-10-CM | POA: Diagnosis not present

## 2019-01-19 DIAGNOSIS — Z8673 Personal history of transient ischemic attack (TIA), and cerebral infarction without residual deficits: Secondary | ICD-10-CM | POA: Diagnosis not present

## 2019-01-22 DIAGNOSIS — E6609 Other obesity due to excess calories: Secondary | ICD-10-CM | POA: Diagnosis not present

## 2019-01-22 DIAGNOSIS — Z23 Encounter for immunization: Secondary | ICD-10-CM | POA: Diagnosis not present

## 2019-01-22 DIAGNOSIS — E46 Unspecified protein-calorie malnutrition: Secondary | ICD-10-CM | POA: Diagnosis not present

## 2019-01-22 DIAGNOSIS — I4891 Unspecified atrial fibrillation: Secondary | ICD-10-CM | POA: Diagnosis not present

## 2019-01-22 DIAGNOSIS — E119 Type 2 diabetes mellitus without complications: Secondary | ICD-10-CM | POA: Diagnosis not present

## 2019-01-22 DIAGNOSIS — I214 Non-ST elevation (NSTEMI) myocardial infarction: Secondary | ICD-10-CM | POA: Diagnosis not present

## 2019-01-26 DIAGNOSIS — I4891 Unspecified atrial fibrillation: Secondary | ICD-10-CM | POA: Diagnosis not present

## 2019-01-26 DIAGNOSIS — E46 Unspecified protein-calorie malnutrition: Secondary | ICD-10-CM | POA: Diagnosis not present

## 2019-01-26 DIAGNOSIS — I214 Non-ST elevation (NSTEMI) myocardial infarction: Secondary | ICD-10-CM | POA: Diagnosis not present

## 2019-01-26 DIAGNOSIS — Z23 Encounter for immunization: Secondary | ICD-10-CM | POA: Diagnosis not present

## 2019-01-26 DIAGNOSIS — E6609 Other obesity due to excess calories: Secondary | ICD-10-CM | POA: Diagnosis not present

## 2019-01-26 DIAGNOSIS — E119 Type 2 diabetes mellitus without complications: Secondary | ICD-10-CM | POA: Diagnosis not present

## 2019-02-10 DIAGNOSIS — E785 Hyperlipidemia, unspecified: Secondary | ICD-10-CM | POA: Diagnosis not present

## 2019-02-10 DIAGNOSIS — F0390 Unspecified dementia without behavioral disturbance: Secondary | ICD-10-CM | POA: Diagnosis not present

## 2019-02-10 DIAGNOSIS — D500 Iron deficiency anemia secondary to blood loss (chronic): Secondary | ICD-10-CM | POA: Diagnosis not present

## 2019-02-10 DIAGNOSIS — E6609 Other obesity due to excess calories: Secondary | ICD-10-CM | POA: Diagnosis not present

## 2019-02-12 DIAGNOSIS — F0150 Vascular dementia without behavioral disturbance: Secondary | ICD-10-CM | POA: Diagnosis not present

## 2019-02-12 DIAGNOSIS — F339 Major depressive disorder, recurrent, unspecified: Secondary | ICD-10-CM | POA: Diagnosis not present

## 2019-02-12 DIAGNOSIS — G4700 Insomnia, unspecified: Secondary | ICD-10-CM | POA: Diagnosis not present

## 2019-02-16 DIAGNOSIS — F0150 Vascular dementia without behavioral disturbance: Secondary | ICD-10-CM | POA: Diagnosis not present

## 2019-02-16 DIAGNOSIS — G4700 Insomnia, unspecified: Secondary | ICD-10-CM | POA: Diagnosis not present

## 2019-03-16 DIAGNOSIS — F0150 Vascular dementia without behavioral disturbance: Secondary | ICD-10-CM | POA: Diagnosis not present

## 2019-03-16 DIAGNOSIS — G4700 Insomnia, unspecified: Secondary | ICD-10-CM | POA: Diagnosis not present

## 2019-03-16 DIAGNOSIS — F339 Major depressive disorder, recurrent, unspecified: Secondary | ICD-10-CM | POA: Diagnosis not present

## 2019-04-19 DIAGNOSIS — Z23 Encounter for immunization: Secondary | ICD-10-CM | POA: Diagnosis not present

## 2019-04-20 DIAGNOSIS — E46 Unspecified protein-calorie malnutrition: Secondary | ICD-10-CM | POA: Diagnosis not present

## 2019-04-20 DIAGNOSIS — I2510 Atherosclerotic heart disease of native coronary artery without angina pectoris: Secondary | ICD-10-CM | POA: Diagnosis not present

## 2019-04-20 DIAGNOSIS — Z20828 Contact with and (suspected) exposure to other viral communicable diseases: Secondary | ICD-10-CM | POA: Diagnosis not present

## 2019-04-20 DIAGNOSIS — G9341 Metabolic encephalopathy: Secondary | ICD-10-CM | POA: Diagnosis not present

## 2019-04-20 DIAGNOSIS — K269 Duodenal ulcer, unspecified as acute or chronic, without hemorrhage or perforation: Secondary | ICD-10-CM | POA: Diagnosis not present

## 2019-04-20 DIAGNOSIS — E6609 Other obesity due to excess calories: Secondary | ICD-10-CM | POA: Diagnosis not present

## 2019-04-20 DIAGNOSIS — I4891 Unspecified atrial fibrillation: Secondary | ICD-10-CM | POA: Diagnosis not present

## 2019-04-20 DIAGNOSIS — R54 Age-related physical debility: Secondary | ICD-10-CM | POA: Diagnosis not present

## 2019-04-20 DIAGNOSIS — I5022 Chronic systolic (congestive) heart failure: Secondary | ICD-10-CM | POA: Diagnosis not present

## 2019-04-20 DIAGNOSIS — I480 Paroxysmal atrial fibrillation: Secondary | ICD-10-CM | POA: Diagnosis not present

## 2019-04-20 DIAGNOSIS — K922 Gastrointestinal hemorrhage, unspecified: Secondary | ICD-10-CM | POA: Diagnosis not present

## 2019-04-20 DIAGNOSIS — R601 Generalized edema: Secondary | ICD-10-CM | POA: Diagnosis not present

## 2019-04-20 DIAGNOSIS — E8809 Other disorders of plasma-protein metabolism, not elsewhere classified: Secondary | ICD-10-CM | POA: Diagnosis not present

## 2019-04-20 DIAGNOSIS — D649 Anemia, unspecified: Secondary | ICD-10-CM | POA: Diagnosis not present

## 2019-04-20 DIAGNOSIS — E872 Acidosis: Secondary | ICD-10-CM | POA: Diagnosis not present

## 2019-04-20 DIAGNOSIS — I959 Hypotension, unspecified: Secondary | ICD-10-CM | POA: Diagnosis not present

## 2019-04-20 DIAGNOSIS — Z8673 Personal history of transient ischemic attack (TIA), and cerebral infarction without residual deficits: Secondary | ICD-10-CM | POA: Diagnosis not present

## 2019-04-20 DIAGNOSIS — I213 ST elevation (STEMI) myocardial infarction of unspecified site: Secondary | ICD-10-CM | POA: Diagnosis not present

## 2019-04-20 DIAGNOSIS — I10 Essential (primary) hypertension: Secondary | ICD-10-CM | POA: Diagnosis not present

## 2019-04-20 DIAGNOSIS — I119 Hypertensive heart disease without heart failure: Secondary | ICD-10-CM | POA: Diagnosis not present

## 2019-04-20 DIAGNOSIS — F0280 Dementia in other diseases classified elsewhere without behavioral disturbance: Secondary | ICD-10-CM | POA: Diagnosis not present

## 2019-04-20 DIAGNOSIS — E1165 Type 2 diabetes mellitus with hyperglycemia: Secondary | ICD-10-CM | POA: Diagnosis not present

## 2019-04-20 DIAGNOSIS — I214 Non-ST elevation (NSTEMI) myocardial infarction: Secondary | ICD-10-CM | POA: Diagnosis not present

## 2019-04-20 DIAGNOSIS — E119 Type 2 diabetes mellitus without complications: Secondary | ICD-10-CM | POA: Diagnosis not present

## 2019-04-20 DIAGNOSIS — K2990 Gastroduodenitis, unspecified, without bleeding: Secondary | ICD-10-CM | POA: Diagnosis not present

## 2019-04-23 DIAGNOSIS — E46 Unspecified protein-calorie malnutrition: Secondary | ICD-10-CM | POA: Diagnosis not present

## 2019-04-23 DIAGNOSIS — I4891 Unspecified atrial fibrillation: Secondary | ICD-10-CM | POA: Diagnosis not present

## 2019-04-23 DIAGNOSIS — E6609 Other obesity due to excess calories: Secondary | ICD-10-CM | POA: Diagnosis not present

## 2019-04-23 DIAGNOSIS — G9341 Metabolic encephalopathy: Secondary | ICD-10-CM | POA: Diagnosis not present

## 2019-04-23 DIAGNOSIS — I214 Non-ST elevation (NSTEMI) myocardial infarction: Secondary | ICD-10-CM | POA: Diagnosis not present

## 2019-04-23 DIAGNOSIS — E119 Type 2 diabetes mellitus without complications: Secondary | ICD-10-CM | POA: Diagnosis not present

## 2019-04-24 DIAGNOSIS — E6609 Other obesity due to excess calories: Secondary | ICD-10-CM | POA: Diagnosis not present

## 2019-04-24 DIAGNOSIS — G9341 Metabolic encephalopathy: Secondary | ICD-10-CM | POA: Diagnosis not present

## 2019-04-24 DIAGNOSIS — E119 Type 2 diabetes mellitus without complications: Secondary | ICD-10-CM | POA: Diagnosis not present

## 2019-04-24 DIAGNOSIS — I214 Non-ST elevation (NSTEMI) myocardial infarction: Secondary | ICD-10-CM | POA: Diagnosis not present

## 2019-04-24 DIAGNOSIS — E46 Unspecified protein-calorie malnutrition: Secondary | ICD-10-CM | POA: Diagnosis not present

## 2019-04-24 DIAGNOSIS — I4891 Unspecified atrial fibrillation: Secondary | ICD-10-CM | POA: Diagnosis not present

## 2019-04-27 DIAGNOSIS — E119 Type 2 diabetes mellitus without complications: Secondary | ICD-10-CM | POA: Diagnosis not present

## 2019-04-27 DIAGNOSIS — E6609 Other obesity due to excess calories: Secondary | ICD-10-CM | POA: Diagnosis not present

## 2019-04-27 DIAGNOSIS — I4891 Unspecified atrial fibrillation: Secondary | ICD-10-CM | POA: Diagnosis not present

## 2019-04-27 DIAGNOSIS — E46 Unspecified protein-calorie malnutrition: Secondary | ICD-10-CM | POA: Diagnosis not present

## 2019-04-27 DIAGNOSIS — I214 Non-ST elevation (NSTEMI) myocardial infarction: Secondary | ICD-10-CM | POA: Diagnosis not present

## 2019-04-27 DIAGNOSIS — G9341 Metabolic encephalopathy: Secondary | ICD-10-CM | POA: Diagnosis not present

## 2019-04-29 DIAGNOSIS — D649 Anemia, unspecified: Secondary | ICD-10-CM | POA: Diagnosis not present

## 2019-04-29 DIAGNOSIS — N179 Acute kidney failure, unspecified: Secondary | ICD-10-CM | POA: Diagnosis not present

## 2019-04-29 DIAGNOSIS — R7989 Other specified abnormal findings of blood chemistry: Secondary | ICD-10-CM | POA: Diagnosis not present

## 2019-04-29 DIAGNOSIS — U071 COVID-19: Secondary | ICD-10-CM | POA: Diagnosis not present

## 2019-04-30 DIAGNOSIS — D500 Iron deficiency anemia secondary to blood loss (chronic): Secondary | ICD-10-CM | POA: Diagnosis not present

## 2019-04-30 DIAGNOSIS — K269 Duodenal ulcer, unspecified as acute or chronic, without hemorrhage or perforation: Secondary | ICD-10-CM | POA: Diagnosis not present

## 2019-04-30 DIAGNOSIS — I119 Hypertensive heart disease without heart failure: Secondary | ICD-10-CM | POA: Diagnosis not present

## 2019-04-30 DIAGNOSIS — E8809 Other disorders of plasma-protein metabolism, not elsewhere classified: Secondary | ICD-10-CM | POA: Diagnosis not present

## 2019-04-30 DIAGNOSIS — I5022 Chronic systolic (congestive) heart failure: Secondary | ICD-10-CM | POA: Diagnosis not present

## 2019-04-30 DIAGNOSIS — I10 Essential (primary) hypertension: Secondary | ICD-10-CM | POA: Diagnosis not present

## 2019-04-30 DIAGNOSIS — I4891 Unspecified atrial fibrillation: Secondary | ICD-10-CM | POA: Diagnosis not present

## 2019-04-30 DIAGNOSIS — I214 Non-ST elevation (NSTEMI) myocardial infarction: Secondary | ICD-10-CM | POA: Diagnosis not present

## 2019-04-30 DIAGNOSIS — F0390 Unspecified dementia without behavioral disturbance: Secondary | ICD-10-CM | POA: Diagnosis not present

## 2019-04-30 DIAGNOSIS — D649 Anemia, unspecified: Secondary | ICD-10-CM | POA: Diagnosis not present

## 2019-04-30 DIAGNOSIS — I213 ST elevation (STEMI) myocardial infarction of unspecified site: Secondary | ICD-10-CM | POA: Diagnosis not present

## 2019-04-30 DIAGNOSIS — R54 Age-related physical debility: Secondary | ICD-10-CM | POA: Diagnosis not present

## 2019-04-30 DIAGNOSIS — Z8673 Personal history of transient ischemic attack (TIA), and cerebral infarction without residual deficits: Secondary | ICD-10-CM | POA: Diagnosis not present

## 2019-04-30 DIAGNOSIS — I2510 Atherosclerotic heart disease of native coronary artery without angina pectoris: Secondary | ICD-10-CM | POA: Diagnosis not present

## 2019-04-30 DIAGNOSIS — N189 Chronic kidney disease, unspecified: Secondary | ICD-10-CM | POA: Diagnosis not present

## 2019-04-30 DIAGNOSIS — E559 Vitamin D deficiency, unspecified: Secondary | ICD-10-CM | POA: Diagnosis not present

## 2019-04-30 DIAGNOSIS — F0280 Dementia in other diseases classified elsewhere without behavioral disturbance: Secondary | ICD-10-CM | POA: Diagnosis not present

## 2019-04-30 DIAGNOSIS — K2990 Gastroduodenitis, unspecified, without bleeding: Secondary | ICD-10-CM | POA: Diagnosis not present

## 2019-04-30 DIAGNOSIS — E6609 Other obesity due to excess calories: Secondary | ICD-10-CM | POA: Diagnosis not present

## 2019-04-30 DIAGNOSIS — K922 Gastrointestinal hemorrhage, unspecified: Secondary | ICD-10-CM | POA: Diagnosis not present

## 2019-04-30 DIAGNOSIS — U071 COVID-19: Secondary | ICD-10-CM | POA: Diagnosis not present

## 2019-04-30 DIAGNOSIS — Z23 Encounter for immunization: Secondary | ICD-10-CM | POA: Diagnosis not present

## 2019-04-30 DIAGNOSIS — F0150 Vascular dementia without behavioral disturbance: Secondary | ICD-10-CM | POA: Diagnosis not present

## 2019-04-30 DIAGNOSIS — E46 Unspecified protein-calorie malnutrition: Secondary | ICD-10-CM | POA: Diagnosis not present

## 2019-04-30 DIAGNOSIS — I959 Hypotension, unspecified: Secondary | ICD-10-CM | POA: Diagnosis not present

## 2019-04-30 DIAGNOSIS — R601 Generalized edema: Secondary | ICD-10-CM | POA: Diagnosis not present

## 2019-04-30 DIAGNOSIS — F4320 Adjustment disorder, unspecified: Secondary | ICD-10-CM | POA: Diagnosis not present

## 2019-04-30 DIAGNOSIS — F329 Major depressive disorder, single episode, unspecified: Secondary | ICD-10-CM | POA: Diagnosis not present

## 2019-04-30 DIAGNOSIS — Z20828 Contact with and (suspected) exposure to other viral communicable diseases: Secondary | ICD-10-CM | POA: Diagnosis not present

## 2019-04-30 DIAGNOSIS — I480 Paroxysmal atrial fibrillation: Secondary | ICD-10-CM | POA: Diagnosis not present

## 2019-04-30 DIAGNOSIS — R7989 Other specified abnormal findings of blood chemistry: Secondary | ICD-10-CM | POA: Diagnosis not present

## 2019-04-30 DIAGNOSIS — N179 Acute kidney failure, unspecified: Secondary | ICD-10-CM | POA: Diagnosis not present

## 2019-05-04 DIAGNOSIS — D649 Anemia, unspecified: Secondary | ICD-10-CM | POA: Diagnosis not present

## 2019-05-04 DIAGNOSIS — R7989 Other specified abnormal findings of blood chemistry: Secondary | ICD-10-CM | POA: Diagnosis not present

## 2019-05-04 DIAGNOSIS — U071 COVID-19: Secondary | ICD-10-CM | POA: Diagnosis not present

## 2019-05-04 DIAGNOSIS — N179 Acute kidney failure, unspecified: Secondary | ICD-10-CM | POA: Diagnosis not present

## 2019-05-21 DIAGNOSIS — D500 Iron deficiency anemia secondary to blood loss (chronic): Secondary | ICD-10-CM | POA: Diagnosis not present

## 2019-05-21 DIAGNOSIS — U071 COVID-19: Secondary | ICD-10-CM | POA: Diagnosis not present

## 2019-05-21 DIAGNOSIS — F339 Major depressive disorder, recurrent, unspecified: Secondary | ICD-10-CM | POA: Diagnosis not present

## 2019-05-21 DIAGNOSIS — K922 Gastrointestinal hemorrhage, unspecified: Secondary | ICD-10-CM | POA: Diagnosis not present

## 2019-05-21 DIAGNOSIS — F0150 Vascular dementia without behavioral disturbance: Secondary | ICD-10-CM | POA: Diagnosis not present

## 2019-06-08 DIAGNOSIS — K219 Gastro-esophageal reflux disease without esophagitis: Secondary | ICD-10-CM | POA: Diagnosis not present

## 2019-06-08 DIAGNOSIS — R112 Nausea with vomiting, unspecified: Secondary | ICD-10-CM | POA: Diagnosis not present

## 2019-06-10 DIAGNOSIS — F0390 Unspecified dementia without behavioral disturbance: Secondary | ICD-10-CM | POA: Diagnosis not present

## 2019-06-10 DIAGNOSIS — I2510 Atherosclerotic heart disease of native coronary artery without angina pectoris: Secondary | ICD-10-CM | POA: Diagnosis not present

## 2019-06-10 DIAGNOSIS — I10 Essential (primary) hypertension: Secondary | ICD-10-CM | POA: Diagnosis not present

## 2019-06-10 DIAGNOSIS — E785 Hyperlipidemia, unspecified: Secondary | ICD-10-CM | POA: Diagnosis not present

## 2019-06-18 DIAGNOSIS — F339 Major depressive disorder, recurrent, unspecified: Secondary | ICD-10-CM | POA: Diagnosis not present

## 2019-06-18 DIAGNOSIS — F0150 Vascular dementia without behavioral disturbance: Secondary | ICD-10-CM | POA: Diagnosis not present

## 2019-06-24 DIAGNOSIS — R6889 Other general symptoms and signs: Secondary | ICD-10-CM | POA: Diagnosis not present

## 2019-06-25 DIAGNOSIS — Z79899 Other long term (current) drug therapy: Secondary | ICD-10-CM | POA: Diagnosis not present

## 2019-06-25 DIAGNOSIS — K922 Gastrointestinal hemorrhage, unspecified: Secondary | ICD-10-CM | POA: Diagnosis not present

## 2019-06-25 DIAGNOSIS — D500 Iron deficiency anemia secondary to blood loss (chronic): Secondary | ICD-10-CM | POA: Diagnosis not present

## 2019-06-25 DIAGNOSIS — F0390 Unspecified dementia without behavioral disturbance: Secondary | ICD-10-CM | POA: Diagnosis not present

## 2019-07-16 DIAGNOSIS — G4700 Insomnia, unspecified: Secondary | ICD-10-CM | POA: Diagnosis not present

## 2019-07-16 DIAGNOSIS — F0150 Vascular dementia without behavioral disturbance: Secondary | ICD-10-CM | POA: Diagnosis not present

## 2019-07-16 DIAGNOSIS — F339 Major depressive disorder, recurrent, unspecified: Secondary | ICD-10-CM | POA: Diagnosis not present

## 2019-08-06 DIAGNOSIS — D500 Iron deficiency anemia secondary to blood loss (chronic): Secondary | ICD-10-CM | POA: Diagnosis not present

## 2019-08-06 DIAGNOSIS — N189 Chronic kidney disease, unspecified: Secondary | ICD-10-CM | POA: Diagnosis not present

## 2019-08-06 DIAGNOSIS — K922 Gastrointestinal hemorrhage, unspecified: Secondary | ICD-10-CM | POA: Diagnosis not present

## 2019-08-06 DIAGNOSIS — E119 Type 2 diabetes mellitus without complications: Secondary | ICD-10-CM | POA: Diagnosis not present

## 2019-08-13 DIAGNOSIS — G4700 Insomnia, unspecified: Secondary | ICD-10-CM | POA: Diagnosis not present

## 2019-08-13 DIAGNOSIS — F0150 Vascular dementia without behavioral disturbance: Secondary | ICD-10-CM | POA: Diagnosis not present

## 2019-08-13 DIAGNOSIS — F339 Major depressive disorder, recurrent, unspecified: Secondary | ICD-10-CM | POA: Diagnosis not present

## 2019-08-18 DIAGNOSIS — N189 Chronic kidney disease, unspecified: Secondary | ICD-10-CM | POA: Diagnosis not present

## 2019-08-23 DIAGNOSIS — F0150 Vascular dementia without behavioral disturbance: Secondary | ICD-10-CM | POA: Diagnosis not present

## 2019-08-23 DIAGNOSIS — F329 Major depressive disorder, single episode, unspecified: Secondary | ICD-10-CM | POA: Diagnosis not present

## 2019-08-23 DIAGNOSIS — I10 Essential (primary) hypertension: Secondary | ICD-10-CM | POA: Diagnosis not present

## 2019-08-23 DIAGNOSIS — I119 Hypertensive heart disease without heart failure: Secondary | ICD-10-CM | POA: Diagnosis not present

## 2019-08-23 DIAGNOSIS — K2990 Gastroduodenitis, unspecified, without bleeding: Secondary | ICD-10-CM | POA: Diagnosis not present

## 2019-08-23 DIAGNOSIS — E8809 Other disorders of plasma-protein metabolism, not elsewhere classified: Secondary | ICD-10-CM | POA: Diagnosis not present

## 2019-08-23 DIAGNOSIS — I5022 Chronic systolic (congestive) heart failure: Secondary | ICD-10-CM | POA: Diagnosis not present

## 2019-08-23 DIAGNOSIS — E6609 Other obesity due to excess calories: Secondary | ICD-10-CM | POA: Diagnosis not present

## 2019-08-23 DIAGNOSIS — Z20828 Contact with and (suspected) exposure to other viral communicable diseases: Secondary | ICD-10-CM | POA: Diagnosis not present

## 2019-08-23 DIAGNOSIS — K922 Gastrointestinal hemorrhage, unspecified: Secondary | ICD-10-CM | POA: Diagnosis not present

## 2019-08-23 DIAGNOSIS — I4891 Unspecified atrial fibrillation: Secondary | ICD-10-CM | POA: Diagnosis not present

## 2019-08-23 DIAGNOSIS — E46 Unspecified protein-calorie malnutrition: Secondary | ICD-10-CM | POA: Diagnosis not present

## 2019-08-23 DIAGNOSIS — I959 Hypotension, unspecified: Secondary | ICD-10-CM | POA: Diagnosis not present

## 2019-08-23 DIAGNOSIS — N189 Chronic kidney disease, unspecified: Secondary | ICD-10-CM | POA: Diagnosis not present

## 2019-08-23 DIAGNOSIS — Z8673 Personal history of transient ischemic attack (TIA), and cerebral infarction without residual deficits: Secondary | ICD-10-CM | POA: Diagnosis not present

## 2019-08-23 DIAGNOSIS — K269 Duodenal ulcer, unspecified as acute or chronic, without hemorrhage or perforation: Secondary | ICD-10-CM | POA: Diagnosis not present

## 2019-08-23 DIAGNOSIS — I214 Non-ST elevation (NSTEMI) myocardial infarction: Secondary | ICD-10-CM | POA: Diagnosis not present

## 2019-08-23 DIAGNOSIS — I480 Paroxysmal atrial fibrillation: Secondary | ICD-10-CM | POA: Diagnosis not present

## 2019-08-23 DIAGNOSIS — I213 ST elevation (STEMI) myocardial infarction of unspecified site: Secondary | ICD-10-CM | POA: Diagnosis not present

## 2019-08-23 DIAGNOSIS — R601 Generalized edema: Secondary | ICD-10-CM | POA: Diagnosis not present

## 2019-08-23 DIAGNOSIS — I2510 Atherosclerotic heart disease of native coronary artery without angina pectoris: Secondary | ICD-10-CM | POA: Diagnosis not present

## 2019-08-23 DIAGNOSIS — F0280 Dementia in other diseases classified elsewhere without behavioral disturbance: Secondary | ICD-10-CM | POA: Diagnosis not present

## 2019-08-23 DIAGNOSIS — Z8616 Personal history of COVID-19: Secondary | ICD-10-CM | POA: Diagnosis not present

## 2019-08-23 DIAGNOSIS — F4320 Adjustment disorder, unspecified: Secondary | ICD-10-CM | POA: Diagnosis not present

## 2019-08-23 DIAGNOSIS — R54 Age-related physical debility: Secondary | ICD-10-CM | POA: Diagnosis not present

## 2019-09-10 DIAGNOSIS — G4700 Insomnia, unspecified: Secondary | ICD-10-CM | POA: Diagnosis not present

## 2019-09-10 DIAGNOSIS — F339 Major depressive disorder, recurrent, unspecified: Secondary | ICD-10-CM | POA: Diagnosis not present

## 2019-09-10 DIAGNOSIS — F0150 Vascular dementia without behavioral disturbance: Secondary | ICD-10-CM | POA: Diagnosis not present

## 2019-09-15 DIAGNOSIS — D649 Anemia, unspecified: Secondary | ICD-10-CM | POA: Diagnosis not present

## 2019-09-16 DIAGNOSIS — G9389 Other specified disorders of brain: Secondary | ICD-10-CM | POA: Diagnosis not present

## 2019-09-16 DIAGNOSIS — E162 Hypoglycemia, unspecified: Secondary | ICD-10-CM | POA: Diagnosis not present

## 2019-09-16 DIAGNOSIS — G319 Degenerative disease of nervous system, unspecified: Secondary | ICD-10-CM | POA: Diagnosis not present

## 2019-09-16 DIAGNOSIS — F0390 Unspecified dementia without behavioral disturbance: Secondary | ICD-10-CM | POA: Diagnosis not present

## 2019-09-16 DIAGNOSIS — R0689 Other abnormalities of breathing: Secondary | ICD-10-CM | POA: Diagnosis not present

## 2019-09-16 DIAGNOSIS — Z8673 Personal history of transient ischemic attack (TIA), and cerebral infarction without residual deficits: Secondary | ICD-10-CM | POA: Diagnosis not present

## 2019-09-16 DIAGNOSIS — R404 Transient alteration of awareness: Secondary | ICD-10-CM | POA: Diagnosis not present

## 2019-09-16 DIAGNOSIS — Z6835 Body mass index (BMI) 35.0-35.9, adult: Secondary | ICD-10-CM | POA: Diagnosis not present

## 2019-09-16 DIAGNOSIS — E785 Hyperlipidemia, unspecified: Secondary | ICD-10-CM | POA: Diagnosis not present

## 2019-09-16 DIAGNOSIS — Z043 Encounter for examination and observation following other accident: Secondary | ICD-10-CM | POA: Diagnosis not present

## 2019-09-16 DIAGNOSIS — N289 Disorder of kidney and ureter, unspecified: Secondary | ICD-10-CM | POA: Diagnosis not present

## 2019-09-16 DIAGNOSIS — I10 Essential (primary) hypertension: Secondary | ICD-10-CM | POA: Diagnosis not present

## 2019-09-16 DIAGNOSIS — J45909 Unspecified asthma, uncomplicated: Secondary | ICD-10-CM | POA: Diagnosis not present

## 2019-09-16 DIAGNOSIS — I2510 Atherosclerotic heart disease of native coronary artery without angina pectoris: Secondary | ICD-10-CM | POA: Diagnosis not present

## 2019-09-16 DIAGNOSIS — R296 Repeated falls: Secondary | ICD-10-CM | POA: Diagnosis not present

## 2019-09-16 DIAGNOSIS — E11649 Type 2 diabetes mellitus with hypoglycemia without coma: Secondary | ICD-10-CM | POA: Diagnosis not present

## 2019-09-16 DIAGNOSIS — E161 Other hypoglycemia: Secondary | ICD-10-CM | POA: Diagnosis not present

## 2019-09-16 DIAGNOSIS — R2681 Unsteadiness on feet: Secondary | ICD-10-CM | POA: Diagnosis not present

## 2019-09-16 DIAGNOSIS — J9811 Atelectasis: Secondary | ICD-10-CM | POA: Diagnosis not present

## 2019-09-16 DIAGNOSIS — Z7984 Long term (current) use of oral hypoglycemic drugs: Secondary | ICD-10-CM | POA: Diagnosis not present

## 2019-09-16 DIAGNOSIS — R52 Pain, unspecified: Secondary | ICD-10-CM | POA: Diagnosis not present

## 2019-09-16 DIAGNOSIS — E669 Obesity, unspecified: Secondary | ICD-10-CM | POA: Diagnosis not present

## 2019-09-16 DIAGNOSIS — Z7982 Long term (current) use of aspirin: Secondary | ICD-10-CM | POA: Diagnosis not present

## 2019-09-16 DIAGNOSIS — E119 Type 2 diabetes mellitus without complications: Secondary | ICD-10-CM | POA: Diagnosis not present

## 2019-09-17 DIAGNOSIS — I739 Peripheral vascular disease, unspecified: Secondary | ICD-10-CM | POA: Diagnosis not present

## 2019-09-17 DIAGNOSIS — Q845 Enlarged and hypertrophic nails: Secondary | ICD-10-CM | POA: Diagnosis not present

## 2019-09-17 DIAGNOSIS — E119 Type 2 diabetes mellitus without complications: Secondary | ICD-10-CM | POA: Diagnosis not present

## 2019-09-17 DIAGNOSIS — L603 Nail dystrophy: Secondary | ICD-10-CM | POA: Diagnosis not present

## 2019-09-17 DIAGNOSIS — B351 Tinea unguium: Secondary | ICD-10-CM | POA: Diagnosis not present

## 2019-11-05 DIAGNOSIS — I4891 Unspecified atrial fibrillation: Secondary | ICD-10-CM | POA: Diagnosis not present

## 2019-11-05 DIAGNOSIS — I214 Non-ST elevation (NSTEMI) myocardial infarction: Secondary | ICD-10-CM | POA: Diagnosis not present

## 2019-11-05 DIAGNOSIS — I10 Essential (primary) hypertension: Secondary | ICD-10-CM | POA: Diagnosis not present

## 2019-11-05 DIAGNOSIS — K269 Duodenal ulcer, unspecified as acute or chronic, without hemorrhage or perforation: Secondary | ICD-10-CM | POA: Diagnosis not present

## 2019-11-05 DIAGNOSIS — F329 Major depressive disorder, single episode, unspecified: Secondary | ICD-10-CM | POA: Diagnosis not present

## 2019-11-05 DIAGNOSIS — N189 Chronic kidney disease, unspecified: Secondary | ICD-10-CM | POA: Diagnosis not present

## 2019-11-05 DIAGNOSIS — I213 ST elevation (STEMI) myocardial infarction of unspecified site: Secondary | ICD-10-CM | POA: Diagnosis not present

## 2019-11-05 DIAGNOSIS — Z8616 Personal history of COVID-19: Secondary | ICD-10-CM | POA: Diagnosis not present

## 2019-11-05 DIAGNOSIS — F339 Major depressive disorder, recurrent, unspecified: Secondary | ICD-10-CM | POA: Diagnosis not present

## 2019-11-05 DIAGNOSIS — I959 Hypotension, unspecified: Secondary | ICD-10-CM | POA: Diagnosis not present

## 2019-11-05 DIAGNOSIS — I2510 Atherosclerotic heart disease of native coronary artery without angina pectoris: Secondary | ICD-10-CM | POA: Diagnosis not present

## 2019-11-05 DIAGNOSIS — R601 Generalized edema: Secondary | ICD-10-CM | POA: Diagnosis not present

## 2019-11-05 DIAGNOSIS — I5022 Chronic systolic (congestive) heart failure: Secondary | ICD-10-CM | POA: Diagnosis not present

## 2019-11-05 DIAGNOSIS — F0280 Dementia in other diseases classified elsewhere without behavioral disturbance: Secondary | ICD-10-CM | POA: Diagnosis not present

## 2019-11-05 DIAGNOSIS — F0150 Vascular dementia without behavioral disturbance: Secondary | ICD-10-CM | POA: Diagnosis not present

## 2019-11-05 DIAGNOSIS — I480 Paroxysmal atrial fibrillation: Secondary | ICD-10-CM | POA: Diagnosis not present

## 2019-11-05 DIAGNOSIS — R54 Age-related physical debility: Secondary | ICD-10-CM | POA: Diagnosis not present

## 2019-11-05 DIAGNOSIS — K2990 Gastroduodenitis, unspecified, without bleeding: Secondary | ICD-10-CM | POA: Diagnosis not present

## 2019-11-05 DIAGNOSIS — K922 Gastrointestinal hemorrhage, unspecified: Secondary | ICD-10-CM | POA: Diagnosis not present

## 2019-11-05 DIAGNOSIS — F4320 Adjustment disorder, unspecified: Secondary | ICD-10-CM | POA: Diagnosis not present

## 2019-11-05 DIAGNOSIS — I119 Hypertensive heart disease without heart failure: Secondary | ICD-10-CM | POA: Diagnosis not present

## 2019-11-05 DIAGNOSIS — Z8673 Personal history of transient ischemic attack (TIA), and cerebral infarction without residual deficits: Secondary | ICD-10-CM | POA: Diagnosis not present

## 2019-11-05 DIAGNOSIS — Z20828 Contact with and (suspected) exposure to other viral communicable diseases: Secondary | ICD-10-CM | POA: Diagnosis not present

## 2019-11-05 DIAGNOSIS — E6609 Other obesity due to excess calories: Secondary | ICD-10-CM | POA: Diagnosis not present

## 2019-11-05 DIAGNOSIS — E46 Unspecified protein-calorie malnutrition: Secondary | ICD-10-CM | POA: Diagnosis not present

## 2019-11-05 DIAGNOSIS — E8809 Other disorders of plasma-protein metabolism, not elsewhere classified: Secondary | ICD-10-CM | POA: Diagnosis not present

## 2019-11-25 DIAGNOSIS — I5022 Chronic systolic (congestive) heart failure: Secondary | ICD-10-CM | POA: Diagnosis not present

## 2019-12-01 DIAGNOSIS — I5022 Chronic systolic (congestive) heart failure: Secondary | ICD-10-CM | POA: Diagnosis not present

## 2019-12-02 DIAGNOSIS — R5383 Other fatigue: Secondary | ICD-10-CM | POA: Diagnosis not present

## 2019-12-02 DIAGNOSIS — R7989 Other specified abnormal findings of blood chemistry: Secondary | ICD-10-CM | POA: Diagnosis not present

## 2019-12-02 DIAGNOSIS — R635 Abnormal weight gain: Secondary | ICD-10-CM | POA: Diagnosis not present

## 2019-12-03 DIAGNOSIS — G4700 Insomnia, unspecified: Secondary | ICD-10-CM | POA: Diagnosis not present

## 2019-12-03 DIAGNOSIS — F0150 Vascular dementia without behavioral disturbance: Secondary | ICD-10-CM | POA: Diagnosis not present

## 2019-12-03 DIAGNOSIS — F339 Major depressive disorder, recurrent, unspecified: Secondary | ICD-10-CM | POA: Diagnosis not present

## 2019-12-07 DIAGNOSIS — Q845 Enlarged and hypertrophic nails: Secondary | ICD-10-CM | POA: Diagnosis not present

## 2019-12-07 DIAGNOSIS — E119 Type 2 diabetes mellitus without complications: Secondary | ICD-10-CM | POA: Diagnosis not present

## 2019-12-07 DIAGNOSIS — L603 Nail dystrophy: Secondary | ICD-10-CM | POA: Diagnosis not present

## 2019-12-07 DIAGNOSIS — I739 Peripheral vascular disease, unspecified: Secondary | ICD-10-CM | POA: Diagnosis not present

## 2019-12-08 DIAGNOSIS — I5022 Chronic systolic (congestive) heart failure: Secondary | ICD-10-CM | POA: Diagnosis not present

## 2019-12-09 DIAGNOSIS — N189 Chronic kidney disease, unspecified: Secondary | ICD-10-CM | POA: Diagnosis not present

## 2019-12-09 DIAGNOSIS — I10 Essential (primary) hypertension: Secondary | ICD-10-CM | POA: Diagnosis not present

## 2019-12-09 DIAGNOSIS — R635 Abnormal weight gain: Secondary | ICD-10-CM | POA: Diagnosis not present

## 2019-12-09 DIAGNOSIS — E119 Type 2 diabetes mellitus without complications: Secondary | ICD-10-CM | POA: Diagnosis not present

## 2019-12-09 DIAGNOSIS — R7989 Other specified abnormal findings of blood chemistry: Secondary | ICD-10-CM | POA: Diagnosis not present

## 2019-12-09 DIAGNOSIS — E785 Hyperlipidemia, unspecified: Secondary | ICD-10-CM | POA: Diagnosis not present

## 2019-12-09 DIAGNOSIS — F0390 Unspecified dementia without behavioral disturbance: Secondary | ICD-10-CM | POA: Diagnosis not present

## 2019-12-11 IMAGING — DX DG CHEST 2V
2 series · 2 of 2 positions shown · non-contrast
Comparison: May 05, 2018

CLINICAL DATA: Shortness of breath

EXAM:
CHEST - 2 VIEW

[x chest ap]
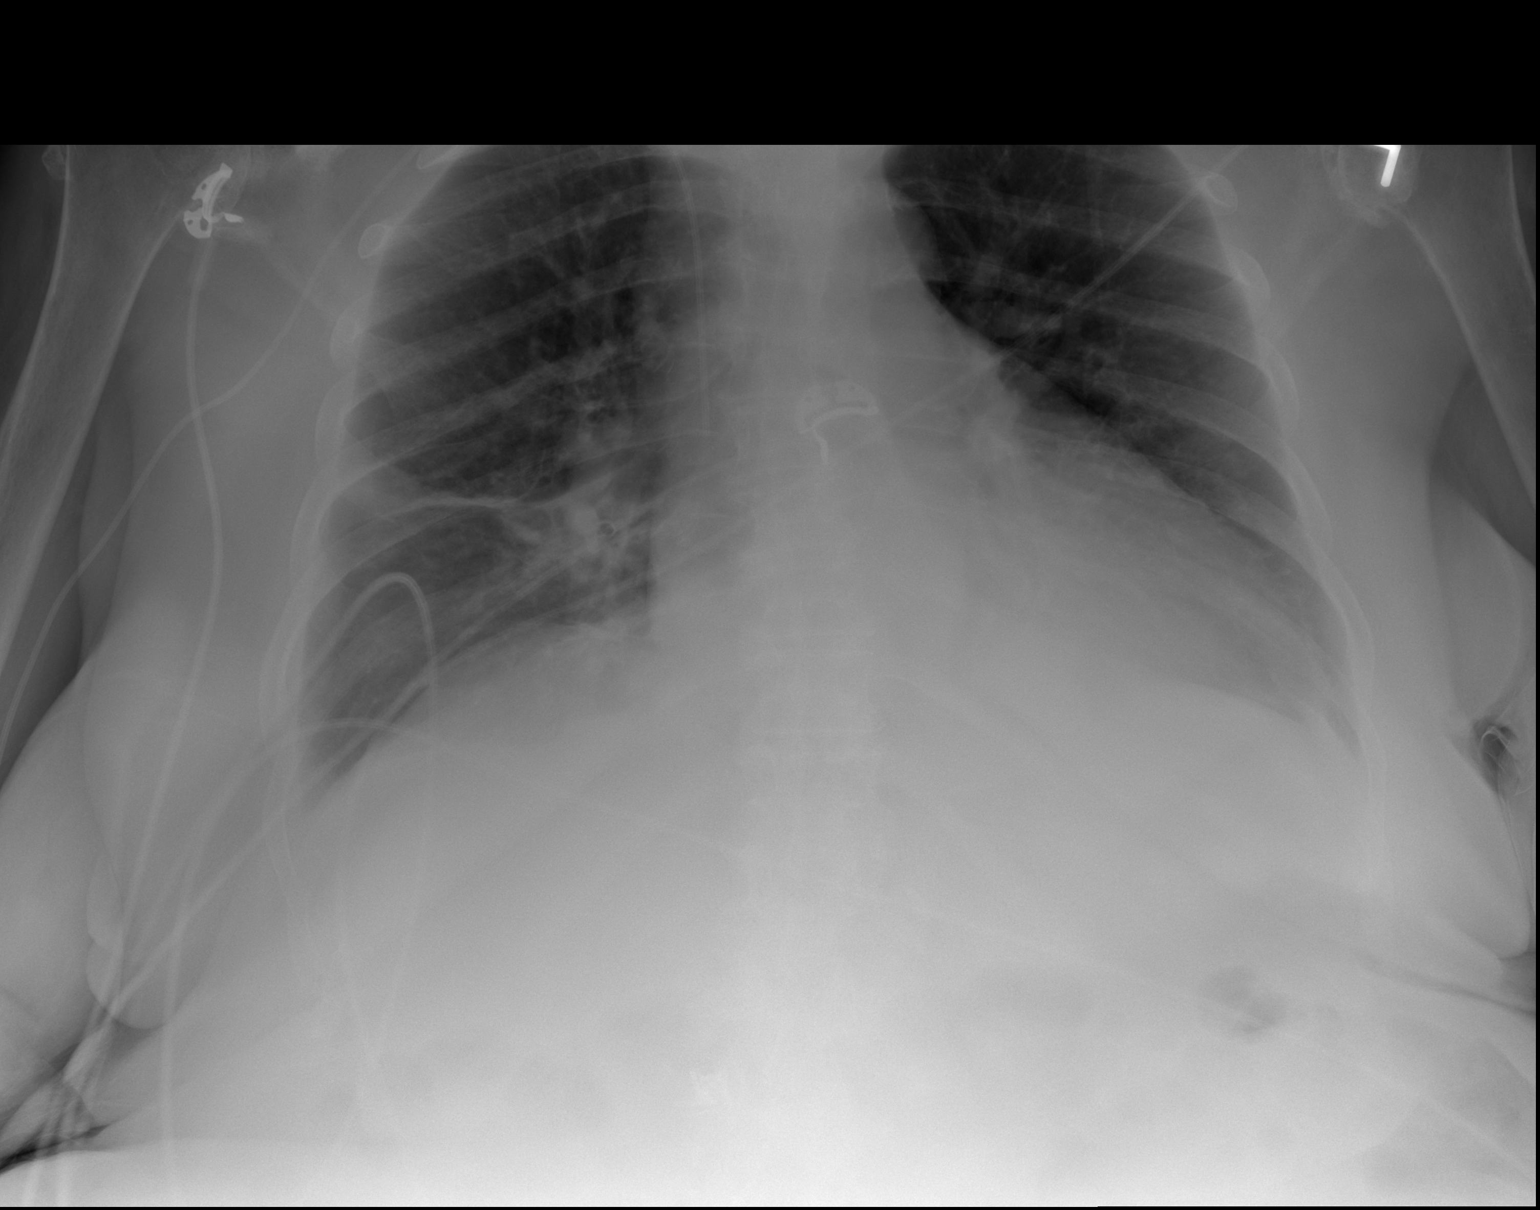

[w chest lat]
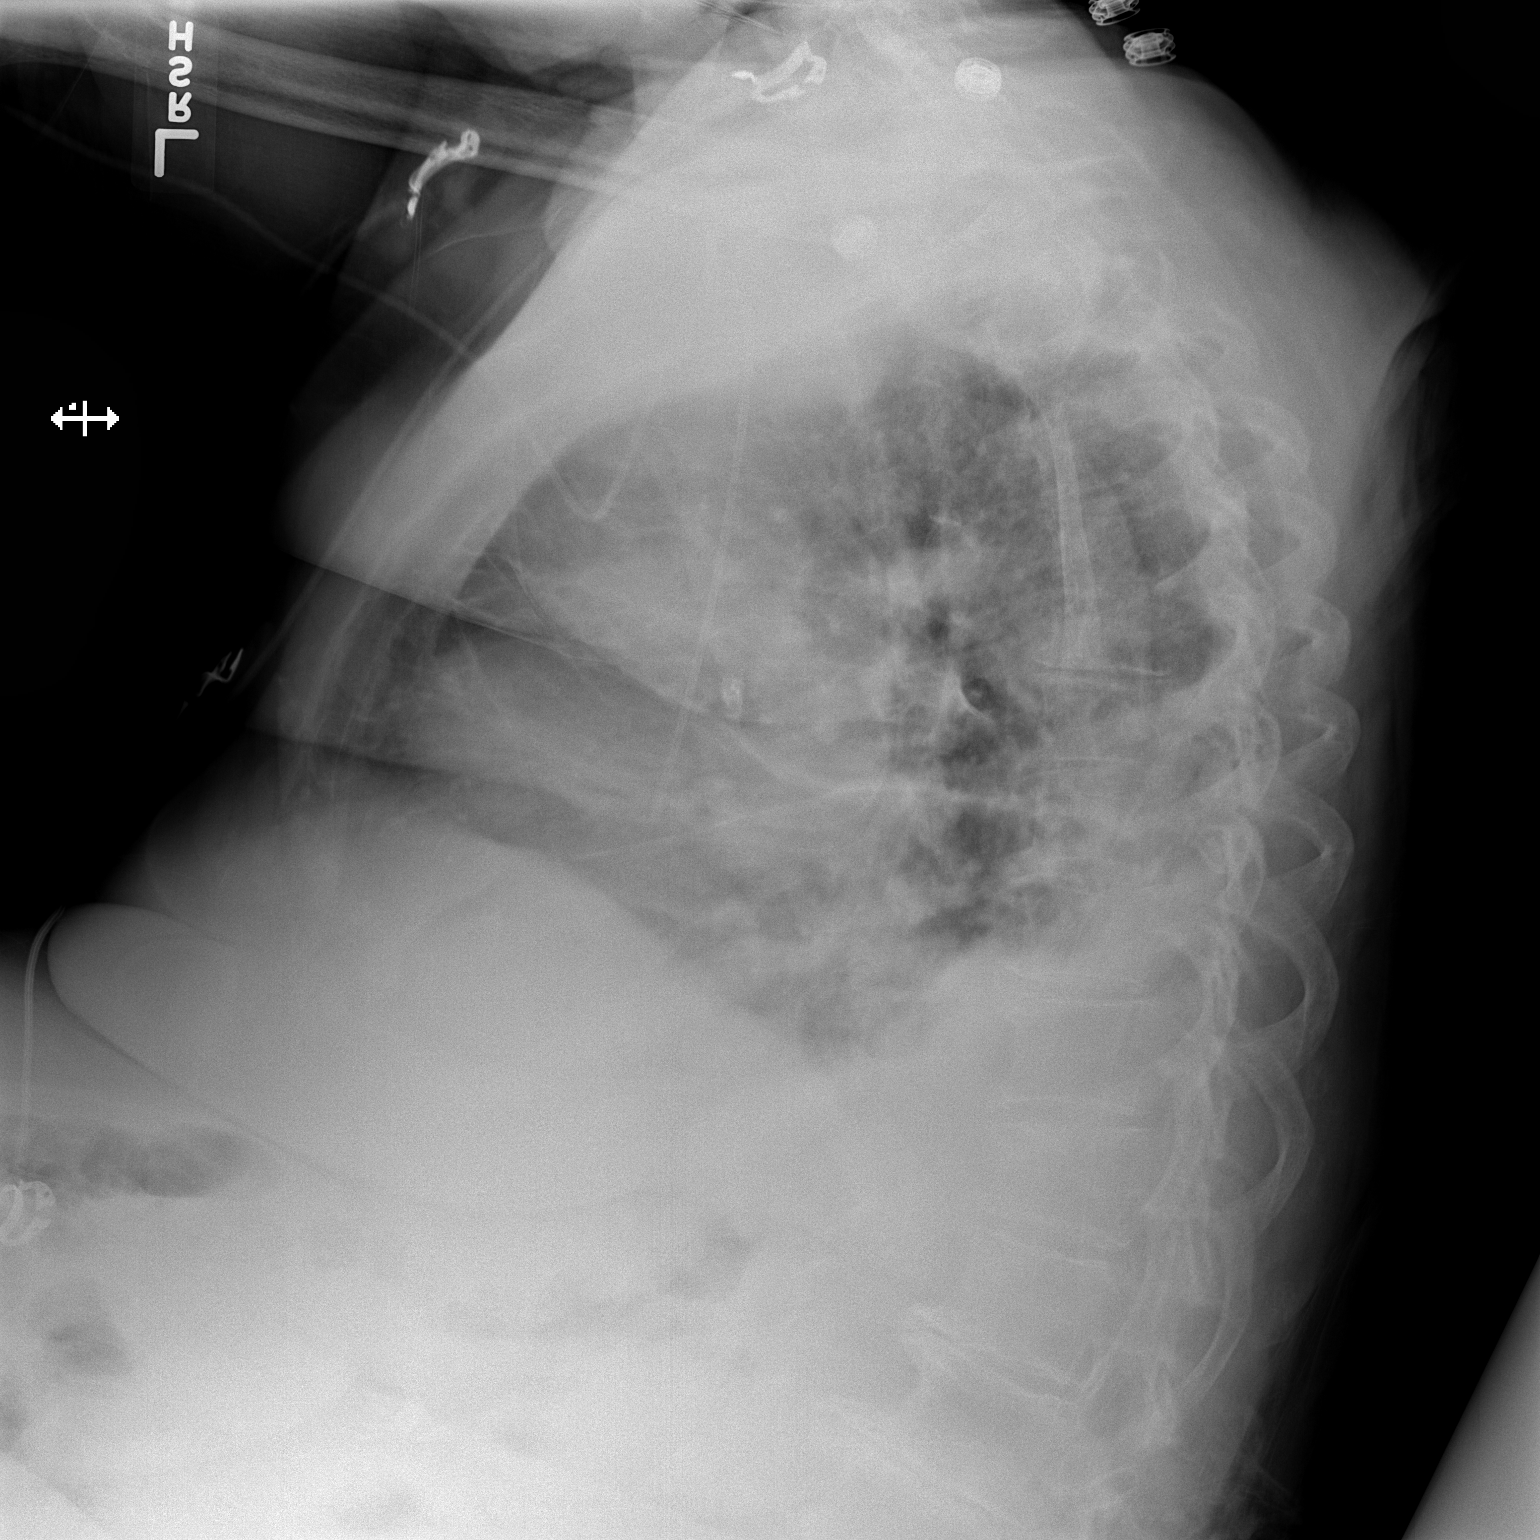

[2 of 2 positions shown; findings below may reference images not displayed]

FINDINGS: Central catheter tip is in the superior vena cava. No pneumothorax.
There is atelectasis in the right mid lung. There is airspace
consolidation in the left lower lobe posteriorly. There is
cardiomegaly with pulmonary vascularity within normal limits. No
adenopathy. No bone lesions.
IMPRESSION: Posterior left base consolidation, likely pneumonia. Mild right
midlung atelectasis. Lungs elsewhere clear. Stable cardiomegaly.
Central catheter tip in superior vena cava. No pneumothorax.

Followup PA and lateral chest radiographs recommended in 3-4 weeks
following trial of antibiotic therapy to ensure resolution and
exclude underlying malignancy.

## 2019-12-15 DIAGNOSIS — F0150 Vascular dementia without behavioral disturbance: Secondary | ICD-10-CM | POA: Diagnosis not present

## 2019-12-15 DIAGNOSIS — I5022 Chronic systolic (congestive) heart failure: Secondary | ICD-10-CM | POA: Diagnosis not present

## 2019-12-15 DIAGNOSIS — F339 Major depressive disorder, recurrent, unspecified: Secondary | ICD-10-CM | POA: Diagnosis not present

## 2019-12-23 DIAGNOSIS — R7989 Other specified abnormal findings of blood chemistry: Secondary | ICD-10-CM | POA: Diagnosis not present

## 2019-12-23 DIAGNOSIS — N179 Acute kidney failure, unspecified: Secondary | ICD-10-CM | POA: Diagnosis not present

## 2019-12-23 DIAGNOSIS — R635 Abnormal weight gain: Secondary | ICD-10-CM | POA: Diagnosis not present

## 2019-12-31 DIAGNOSIS — F339 Major depressive disorder, recurrent, unspecified: Secondary | ICD-10-CM | POA: Diagnosis not present

## 2019-12-31 DIAGNOSIS — G4700 Insomnia, unspecified: Secondary | ICD-10-CM | POA: Diagnosis not present

## 2019-12-31 DIAGNOSIS — F0150 Vascular dementia without behavioral disturbance: Secondary | ICD-10-CM | POA: Diagnosis not present

## 2020-01-19 DIAGNOSIS — I10 Essential (primary) hypertension: Secondary | ICD-10-CM | POA: Diagnosis not present

## 2020-01-19 DIAGNOSIS — I5022 Chronic systolic (congestive) heart failure: Secondary | ICD-10-CM | POA: Diagnosis not present

## 2020-01-20 DIAGNOSIS — R635 Abnormal weight gain: Secondary | ICD-10-CM | POA: Diagnosis not present

## 2020-01-20 DIAGNOSIS — R7989 Other specified abnormal findings of blood chemistry: Secondary | ICD-10-CM | POA: Diagnosis not present

## 2020-01-20 DIAGNOSIS — N179 Acute kidney failure, unspecified: Secondary | ICD-10-CM | POA: Diagnosis not present

## 2020-01-22 DIAGNOSIS — Z8673 Personal history of transient ischemic attack (TIA), and cerebral infarction without residual deficits: Secondary | ICD-10-CM | POA: Diagnosis not present

## 2020-01-22 DIAGNOSIS — E8809 Other disorders of plasma-protein metabolism, not elsewhere classified: Secondary | ICD-10-CM | POA: Diagnosis not present

## 2020-01-22 DIAGNOSIS — N189 Chronic kidney disease, unspecified: Secondary | ICD-10-CM | POA: Diagnosis not present

## 2020-01-22 DIAGNOSIS — R601 Generalized edema: Secondary | ICD-10-CM | POA: Diagnosis not present

## 2020-01-22 DIAGNOSIS — I5022 Chronic systolic (congestive) heart failure: Secondary | ICD-10-CM | POA: Diagnosis not present

## 2020-01-22 DIAGNOSIS — K2990 Gastroduodenitis, unspecified, without bleeding: Secondary | ICD-10-CM | POA: Diagnosis not present

## 2020-01-22 DIAGNOSIS — F0150 Vascular dementia without behavioral disturbance: Secondary | ICD-10-CM | POA: Diagnosis not present

## 2020-01-22 DIAGNOSIS — R54 Age-related physical debility: Secondary | ICD-10-CM | POA: Diagnosis not present

## 2020-01-22 DIAGNOSIS — I213 ST elevation (STEMI) myocardial infarction of unspecified site: Secondary | ICD-10-CM | POA: Diagnosis not present

## 2020-01-22 DIAGNOSIS — I10 Essential (primary) hypertension: Secondary | ICD-10-CM | POA: Diagnosis not present

## 2020-01-22 DIAGNOSIS — F0280 Dementia in other diseases classified elsewhere without behavioral disturbance: Secondary | ICD-10-CM | POA: Diagnosis not present

## 2020-01-22 DIAGNOSIS — I119 Hypertensive heart disease without heart failure: Secondary | ICD-10-CM | POA: Diagnosis not present

## 2020-01-22 DIAGNOSIS — E6609 Other obesity due to excess calories: Secondary | ICD-10-CM | POA: Diagnosis not present

## 2020-01-22 DIAGNOSIS — I2510 Atherosclerotic heart disease of native coronary artery without angina pectoris: Secondary | ICD-10-CM | POA: Diagnosis not present

## 2020-01-22 DIAGNOSIS — K922 Gastrointestinal hemorrhage, unspecified: Secondary | ICD-10-CM | POA: Diagnosis not present

## 2020-01-22 DIAGNOSIS — I959 Hypotension, unspecified: Secondary | ICD-10-CM | POA: Diagnosis not present

## 2020-01-22 DIAGNOSIS — Z8616 Personal history of COVID-19: Secondary | ICD-10-CM | POA: Diagnosis not present

## 2020-01-22 DIAGNOSIS — K269 Duodenal ulcer, unspecified as acute or chronic, without hemorrhage or perforation: Secondary | ICD-10-CM | POA: Diagnosis not present

## 2020-01-22 DIAGNOSIS — E46 Unspecified protein-calorie malnutrition: Secondary | ICD-10-CM | POA: Diagnosis not present

## 2020-01-22 DIAGNOSIS — I480 Paroxysmal atrial fibrillation: Secondary | ICD-10-CM | POA: Diagnosis not present

## 2020-01-22 DIAGNOSIS — Z23 Encounter for immunization: Secondary | ICD-10-CM | POA: Diagnosis not present

## 2020-01-22 DIAGNOSIS — F4320 Adjustment disorder, unspecified: Secondary | ICD-10-CM | POA: Diagnosis not present

## 2020-01-22 DIAGNOSIS — Z20828 Contact with and (suspected) exposure to other viral communicable diseases: Secondary | ICD-10-CM | POA: Diagnosis not present

## 2020-01-22 DIAGNOSIS — I214 Non-ST elevation (NSTEMI) myocardial infarction: Secondary | ICD-10-CM | POA: Diagnosis not present

## 2020-01-22 DIAGNOSIS — F329 Major depressive disorder, single episode, unspecified: Secondary | ICD-10-CM | POA: Diagnosis not present

## 2020-01-25 DIAGNOSIS — F0150 Vascular dementia without behavioral disturbance: Secondary | ICD-10-CM | POA: Diagnosis not present

## 2020-01-25 DIAGNOSIS — G4700 Insomnia, unspecified: Secondary | ICD-10-CM | POA: Diagnosis not present

## 2020-01-25 DIAGNOSIS — F339 Major depressive disorder, recurrent, unspecified: Secondary | ICD-10-CM | POA: Diagnosis not present

## 2020-02-01 DIAGNOSIS — E119 Type 2 diabetes mellitus without complications: Secondary | ICD-10-CM | POA: Diagnosis not present

## 2020-02-01 DIAGNOSIS — Z79899 Other long term (current) drug therapy: Secondary | ICD-10-CM | POA: Diagnosis not present

## 2020-02-02 DIAGNOSIS — E1165 Type 2 diabetes mellitus with hyperglycemia: Secondary | ICD-10-CM | POA: Diagnosis not present

## 2020-02-11 DIAGNOSIS — I2510 Atherosclerotic heart disease of native coronary artery without angina pectoris: Secondary | ICD-10-CM | POA: Diagnosis not present

## 2020-02-11 DIAGNOSIS — I214 Non-ST elevation (NSTEMI) myocardial infarction: Secondary | ICD-10-CM | POA: Diagnosis not present

## 2020-02-11 DIAGNOSIS — R635 Abnormal weight gain: Secondary | ICD-10-CM | POA: Diagnosis not present

## 2020-02-11 DIAGNOSIS — D500 Iron deficiency anemia secondary to blood loss (chronic): Secondary | ICD-10-CM | POA: Diagnosis not present

## 2020-02-11 DIAGNOSIS — E119 Type 2 diabetes mellitus without complications: Secondary | ICD-10-CM | POA: Diagnosis not present

## 2020-02-16 DIAGNOSIS — E7849 Other hyperlipidemia: Secondary | ICD-10-CM | POA: Diagnosis not present

## 2020-02-16 DIAGNOSIS — D649 Anemia, unspecified: Secondary | ICD-10-CM | POA: Diagnosis not present

## 2020-02-16 DIAGNOSIS — I5022 Chronic systolic (congestive) heart failure: Secondary | ICD-10-CM | POA: Diagnosis not present

## 2020-02-16 DIAGNOSIS — I10 Essential (primary) hypertension: Secondary | ICD-10-CM | POA: Diagnosis not present

## 2020-02-17 DIAGNOSIS — F0280 Dementia in other diseases classified elsewhere without behavioral disturbance: Secondary | ICD-10-CM | POA: Diagnosis not present

## 2020-02-17 DIAGNOSIS — I4891 Unspecified atrial fibrillation: Secondary | ICD-10-CM | POA: Diagnosis not present

## 2020-02-17 DIAGNOSIS — N189 Chronic kidney disease, unspecified: Secondary | ICD-10-CM | POA: Diagnosis not present

## 2020-02-17 DIAGNOSIS — E6609 Other obesity due to excess calories: Secondary | ICD-10-CM | POA: Diagnosis not present

## 2020-02-17 DIAGNOSIS — R2681 Unsteadiness on feet: Secondary | ICD-10-CM | POA: Diagnosis not present

## 2020-02-17 DIAGNOSIS — R404 Transient alteration of awareness: Secondary | ICD-10-CM | POA: Diagnosis not present

## 2020-02-17 DIAGNOSIS — Z7984 Long term (current) use of oral hypoglycemic drugs: Secondary | ICD-10-CM | POA: Diagnosis not present

## 2020-02-17 DIAGNOSIS — E1122 Type 2 diabetes mellitus with diabetic chronic kidney disease: Secondary | ICD-10-CM | POA: Diagnosis present

## 2020-02-17 DIAGNOSIS — M6281 Muscle weakness (generalized): Secondary | ICD-10-CM | POA: Diagnosis not present

## 2020-02-17 DIAGNOSIS — Z20828 Contact with and (suspected) exposure to other viral communicable diseases: Secondary | ICD-10-CM | POA: Diagnosis not present

## 2020-02-17 DIAGNOSIS — E860 Dehydration: Secondary | ICD-10-CM | POA: Diagnosis present

## 2020-02-17 DIAGNOSIS — I480 Paroxysmal atrial fibrillation: Secondary | ICD-10-CM | POA: Diagnosis present

## 2020-02-17 DIAGNOSIS — I214 Non-ST elevation (NSTEMI) myocardial infarction: Secondary | ICD-10-CM | POA: Diagnosis not present

## 2020-02-17 DIAGNOSIS — I5022 Chronic systolic (congestive) heart failure: Secondary | ICD-10-CM | POA: Diagnosis not present

## 2020-02-17 DIAGNOSIS — J45909 Unspecified asthma, uncomplicated: Secondary | ICD-10-CM | POA: Diagnosis present

## 2020-02-17 DIAGNOSIS — K922 Gastrointestinal hemorrhage, unspecified: Secondary | ICD-10-CM | POA: Diagnosis not present

## 2020-02-17 DIAGNOSIS — N184 Chronic kidney disease, stage 4 (severe): Secondary | ICD-10-CM | POA: Diagnosis present

## 2020-02-17 DIAGNOSIS — I119 Hypertensive heart disease without heart failure: Secondary | ICD-10-CM | POA: Diagnosis not present

## 2020-02-17 DIAGNOSIS — E8809 Other disorders of plasma-protein metabolism, not elsewhere classified: Secondary | ICD-10-CM | POA: Diagnosis not present

## 2020-02-17 DIAGNOSIS — Z955 Presence of coronary angioplasty implant and graft: Secondary | ICD-10-CM | POA: Diagnosis not present

## 2020-02-17 DIAGNOSIS — R601 Generalized edema: Secondary | ICD-10-CM | POA: Diagnosis not present

## 2020-02-17 DIAGNOSIS — I959 Hypotension, unspecified: Secondary | ICD-10-CM | POA: Diagnosis not present

## 2020-02-17 DIAGNOSIS — Z20822 Contact with and (suspected) exposure to covid-19: Secondary | ICD-10-CM | POA: Diagnosis present

## 2020-02-17 DIAGNOSIS — I213 ST elevation (STEMI) myocardial infarction of unspecified site: Secondary | ICD-10-CM | POA: Diagnosis not present

## 2020-02-17 DIAGNOSIS — R0902 Hypoxemia: Secondary | ICD-10-CM | POA: Diagnosis not present

## 2020-02-17 DIAGNOSIS — E46 Unspecified protein-calorie malnutrition: Secondary | ICD-10-CM | POA: Diagnosis not present

## 2020-02-17 DIAGNOSIS — E785 Hyperlipidemia, unspecified: Secondary | ICD-10-CM | POA: Diagnosis present

## 2020-02-17 DIAGNOSIS — F4320 Adjustment disorder, unspecified: Secondary | ICD-10-CM | POA: Diagnosis not present

## 2020-02-17 DIAGNOSIS — Z1159 Encounter for screening for other viral diseases: Secondary | ICD-10-CM | POA: Diagnosis not present

## 2020-02-17 DIAGNOSIS — E1165 Type 2 diabetes mellitus with hyperglycemia: Secondary | ICD-10-CM | POA: Diagnosis present

## 2020-02-17 DIAGNOSIS — Z8673 Personal history of transient ischemic attack (TIA), and cerebral infarction without residual deficits: Secondary | ICD-10-CM | POA: Diagnosis not present

## 2020-02-17 DIAGNOSIS — N179 Acute kidney failure, unspecified: Secondary | ICD-10-CM | POA: Diagnosis not present

## 2020-02-17 DIAGNOSIS — R269 Unspecified abnormalities of gait and mobility: Secondary | ICD-10-CM | POA: Diagnosis present

## 2020-02-17 DIAGNOSIS — I10 Essential (primary) hypertension: Secondary | ICD-10-CM | POA: Diagnosis not present

## 2020-02-17 DIAGNOSIS — Z0389 Encounter for observation for other suspected diseases and conditions ruled out: Secondary | ICD-10-CM | POA: Diagnosis not present

## 2020-02-17 DIAGNOSIS — K2990 Gastroduodenitis, unspecified, without bleeding: Secondary | ICD-10-CM | POA: Diagnosis not present

## 2020-02-17 DIAGNOSIS — Z8616 Personal history of COVID-19: Secondary | ICD-10-CM | POA: Diagnosis not present

## 2020-02-17 DIAGNOSIS — Z79899 Other long term (current) drug therapy: Secondary | ICD-10-CM | POA: Diagnosis not present

## 2020-02-17 DIAGNOSIS — R5381 Other malaise: Secondary | ICD-10-CM | POA: Diagnosis not present

## 2020-02-17 DIAGNOSIS — M779 Enthesopathy, unspecified: Secondary | ICD-10-CM | POA: Diagnosis not present

## 2020-02-17 DIAGNOSIS — M19011 Primary osteoarthritis, right shoulder: Secondary | ICD-10-CM | POA: Diagnosis not present

## 2020-02-17 DIAGNOSIS — K269 Duodenal ulcer, unspecified as acute or chronic, without hemorrhage or perforation: Secondary | ICD-10-CM | POA: Diagnosis not present

## 2020-02-17 DIAGNOSIS — F329 Major depressive disorder, single episode, unspecified: Secondary | ICD-10-CM | POA: Diagnosis not present

## 2020-02-17 DIAGNOSIS — F0150 Vascular dementia without behavioral disturbance: Secondary | ICD-10-CM | POA: Diagnosis not present

## 2020-02-17 DIAGNOSIS — Z794 Long term (current) use of insulin: Secondary | ICD-10-CM | POA: Diagnosis not present

## 2020-02-17 DIAGNOSIS — I2510 Atherosclerotic heart disease of native coronary artery without angina pectoris: Secondary | ICD-10-CM | POA: Diagnosis present

## 2020-02-22 DIAGNOSIS — Z23 Encounter for immunization: Secondary | ICD-10-CM | POA: Diagnosis not present

## 2020-02-22 DIAGNOSIS — I4891 Unspecified atrial fibrillation: Secondary | ICD-10-CM | POA: Diagnosis not present

## 2020-02-22 DIAGNOSIS — F339 Major depressive disorder, recurrent, unspecified: Secondary | ICD-10-CM | POA: Diagnosis not present

## 2020-02-22 DIAGNOSIS — R269 Unspecified abnormalities of gait and mobility: Secondary | ICD-10-CM | POA: Diagnosis not present

## 2020-02-22 DIAGNOSIS — R601 Generalized edema: Secondary | ICD-10-CM | POA: Diagnosis not present

## 2020-02-22 DIAGNOSIS — K922 Gastrointestinal hemorrhage, unspecified: Secondary | ICD-10-CM | POA: Diagnosis not present

## 2020-02-22 DIAGNOSIS — E8809 Other disorders of plasma-protein metabolism, not elsewhere classified: Secondary | ICD-10-CM | POA: Diagnosis not present

## 2020-02-22 DIAGNOSIS — N179 Acute kidney failure, unspecified: Secondary | ICD-10-CM | POA: Diagnosis not present

## 2020-02-22 DIAGNOSIS — I214 Non-ST elevation (NSTEMI) myocardial infarction: Secondary | ICD-10-CM | POA: Diagnosis not present

## 2020-02-22 DIAGNOSIS — Z20822 Contact with and (suspected) exposure to covid-19: Secondary | ICD-10-CM | POA: Diagnosis not present

## 2020-02-22 DIAGNOSIS — I119 Hypertensive heart disease without heart failure: Secondary | ICD-10-CM | POA: Diagnosis not present

## 2020-02-22 DIAGNOSIS — I2510 Atherosclerotic heart disease of native coronary artery without angina pectoris: Secondary | ICD-10-CM | POA: Diagnosis not present

## 2020-02-22 DIAGNOSIS — I213 ST elevation (STEMI) myocardial infarction of unspecified site: Secondary | ICD-10-CM | POA: Diagnosis not present

## 2020-02-22 DIAGNOSIS — N184 Chronic kidney disease, stage 4 (severe): Secondary | ICD-10-CM | POA: Diagnosis not present

## 2020-02-22 DIAGNOSIS — I10 Essential (primary) hypertension: Secondary | ICD-10-CM | POA: Diagnosis not present

## 2020-02-22 DIAGNOSIS — E46 Unspecified protein-calorie malnutrition: Secondary | ICD-10-CM | POA: Diagnosis not present

## 2020-02-22 DIAGNOSIS — Z20828 Contact with and (suspected) exposure to other viral communicable diseases: Secondary | ICD-10-CM | POA: Diagnosis not present

## 2020-02-22 DIAGNOSIS — D649 Anemia, unspecified: Secondary | ICD-10-CM | POA: Diagnosis not present

## 2020-02-22 DIAGNOSIS — F0280 Dementia in other diseases classified elsewhere without behavioral disturbance: Secondary | ICD-10-CM | POA: Diagnosis not present

## 2020-02-22 DIAGNOSIS — M6281 Muscle weakness (generalized): Secondary | ICD-10-CM | POA: Diagnosis not present

## 2020-02-22 DIAGNOSIS — N189 Chronic kidney disease, unspecified: Secondary | ICD-10-CM | POA: Diagnosis not present

## 2020-02-22 DIAGNOSIS — E1165 Type 2 diabetes mellitus with hyperglycemia: Secondary | ICD-10-CM | POA: Diagnosis not present

## 2020-02-22 DIAGNOSIS — I5022 Chronic systolic (congestive) heart failure: Secondary | ICD-10-CM | POA: Diagnosis not present

## 2020-02-22 DIAGNOSIS — R2681 Unsteadiness on feet: Secondary | ICD-10-CM | POA: Diagnosis not present

## 2020-02-22 DIAGNOSIS — E6609 Other obesity due to excess calories: Secondary | ICD-10-CM | POA: Diagnosis not present

## 2020-02-22 DIAGNOSIS — F4320 Adjustment disorder, unspecified: Secondary | ICD-10-CM | POA: Diagnosis not present

## 2020-02-22 DIAGNOSIS — F329 Major depressive disorder, single episode, unspecified: Secondary | ICD-10-CM | POA: Diagnosis not present

## 2020-02-22 DIAGNOSIS — I480 Paroxysmal atrial fibrillation: Secondary | ICD-10-CM | POA: Diagnosis not present

## 2020-02-22 DIAGNOSIS — E119 Type 2 diabetes mellitus without complications: Secondary | ICD-10-CM | POA: Diagnosis not present

## 2020-02-22 DIAGNOSIS — K269 Duodenal ulcer, unspecified as acute or chronic, without hemorrhage or perforation: Secondary | ICD-10-CM | POA: Diagnosis not present

## 2020-02-22 DIAGNOSIS — Z8616 Personal history of COVID-19: Secondary | ICD-10-CM | POA: Diagnosis not present

## 2020-02-22 DIAGNOSIS — R059 Cough, unspecified: Secondary | ICD-10-CM | POA: Diagnosis not present

## 2020-02-22 DIAGNOSIS — F0150 Vascular dementia without behavioral disturbance: Secondary | ICD-10-CM | POA: Diagnosis not present

## 2020-02-22 DIAGNOSIS — I959 Hypotension, unspecified: Secondary | ICD-10-CM | POA: Diagnosis not present

## 2020-02-22 DIAGNOSIS — K2990 Gastroduodenitis, unspecified, without bleeding: Secondary | ICD-10-CM | POA: Diagnosis not present

## 2020-02-24 DIAGNOSIS — D649 Anemia, unspecified: Secondary | ICD-10-CM | POA: Diagnosis not present

## 2020-02-24 DIAGNOSIS — N179 Acute kidney failure, unspecified: Secondary | ICD-10-CM | POA: Diagnosis not present

## 2020-02-24 DIAGNOSIS — E119 Type 2 diabetes mellitus without complications: Secondary | ICD-10-CM | POA: Diagnosis not present

## 2020-02-24 DIAGNOSIS — I10 Essential (primary) hypertension: Secondary | ICD-10-CM | POA: Diagnosis not present

## 2020-02-24 DIAGNOSIS — I2510 Atherosclerotic heart disease of native coronary artery without angina pectoris: Secondary | ICD-10-CM | POA: Diagnosis not present

## 2020-02-25 DIAGNOSIS — E119 Type 2 diabetes mellitus without complications: Secondary | ICD-10-CM | POA: Diagnosis not present

## 2020-02-25 DIAGNOSIS — K922 Gastrointestinal hemorrhage, unspecified: Secondary | ICD-10-CM | POA: Diagnosis not present

## 2020-02-25 DIAGNOSIS — I2510 Atherosclerotic heart disease of native coronary artery without angina pectoris: Secondary | ICD-10-CM | POA: Diagnosis not present

## 2020-02-25 DIAGNOSIS — N179 Acute kidney failure, unspecified: Secondary | ICD-10-CM | POA: Diagnosis not present

## 2020-03-04 DIAGNOSIS — I213 ST elevation (STEMI) myocardial infarction of unspecified site: Secondary | ICD-10-CM | POA: Diagnosis not present

## 2020-03-04 DIAGNOSIS — F339 Major depressive disorder, recurrent, unspecified: Secondary | ICD-10-CM | POA: Diagnosis not present

## 2020-03-07 DIAGNOSIS — F0150 Vascular dementia without behavioral disturbance: Secondary | ICD-10-CM | POA: Diagnosis not present

## 2020-03-07 DIAGNOSIS — F339 Major depressive disorder, recurrent, unspecified: Secondary | ICD-10-CM | POA: Diagnosis not present

## 2020-03-07 DIAGNOSIS — G4700 Insomnia, unspecified: Secondary | ICD-10-CM | POA: Diagnosis not present

## 2020-03-08 DIAGNOSIS — R1311 Dysphagia, oral phase: Secondary | ICD-10-CM | POA: Diagnosis not present

## 2020-03-08 DIAGNOSIS — N184 Chronic kidney disease, stage 4 (severe): Secondary | ICD-10-CM | POA: Diagnosis not present

## 2020-03-09 DIAGNOSIS — R1311 Dysphagia, oral phase: Secondary | ICD-10-CM | POA: Diagnosis not present

## 2020-03-10 DIAGNOSIS — R1311 Dysphagia, oral phase: Secondary | ICD-10-CM | POA: Diagnosis not present

## 2020-03-14 DIAGNOSIS — R1311 Dysphagia, oral phase: Secondary | ICD-10-CM | POA: Diagnosis not present

## 2020-03-14 DIAGNOSIS — M25532 Pain in left wrist: Secondary | ICD-10-CM | POA: Diagnosis not present

## 2020-03-15 DIAGNOSIS — R1311 Dysphagia, oral phase: Secondary | ICD-10-CM | POA: Diagnosis not present

## 2020-03-16 DIAGNOSIS — R1311 Dysphagia, oral phase: Secondary | ICD-10-CM | POA: Diagnosis not present

## 2020-03-17 DIAGNOSIS — R1311 Dysphagia, oral phase: Secondary | ICD-10-CM | POA: Diagnosis not present

## 2020-03-21 DIAGNOSIS — R1311 Dysphagia, oral phase: Secondary | ICD-10-CM | POA: Diagnosis not present

## 2020-03-21 DIAGNOSIS — E119 Type 2 diabetes mellitus without complications: Secondary | ICD-10-CM | POA: Diagnosis not present

## 2020-03-21 DIAGNOSIS — I10 Essential (primary) hypertension: Secondary | ICD-10-CM | POA: Diagnosis not present

## 2020-03-21 DIAGNOSIS — N189 Chronic kidney disease, unspecified: Secondary | ICD-10-CM | POA: Diagnosis not present

## 2020-03-21 DIAGNOSIS — F0390 Unspecified dementia without behavioral disturbance: Secondary | ICD-10-CM | POA: Diagnosis not present

## 2020-03-21 DIAGNOSIS — Z9181 History of falling: Secondary | ICD-10-CM | POA: Diagnosis not present

## 2020-03-22 DIAGNOSIS — N184 Chronic kidney disease, stage 4 (severe): Secondary | ICD-10-CM | POA: Diagnosis not present

## 2020-03-22 DIAGNOSIS — R1311 Dysphagia, oral phase: Secondary | ICD-10-CM | POA: Diagnosis not present

## 2020-03-25 DIAGNOSIS — R1311 Dysphagia, oral phase: Secondary | ICD-10-CM | POA: Diagnosis not present

## 2020-03-26 DIAGNOSIS — R1311 Dysphagia, oral phase: Secondary | ICD-10-CM | POA: Diagnosis not present

## 2020-03-28 DIAGNOSIS — R1110 Vomiting, unspecified: Secondary | ICD-10-CM | POA: Diagnosis not present

## 2020-03-28 DIAGNOSIS — R1311 Dysphagia, oral phase: Secondary | ICD-10-CM | POA: Diagnosis not present

## 2020-03-29 DIAGNOSIS — R1311 Dysphagia, oral phase: Secondary | ICD-10-CM | POA: Diagnosis not present

## 2020-03-31 DIAGNOSIS — R609 Edema, unspecified: Secondary | ICD-10-CM | POA: Diagnosis not present

## 2020-03-31 DIAGNOSIS — R238 Other skin changes: Secondary | ICD-10-CM | POA: Diagnosis not present

## 2020-03-31 DIAGNOSIS — E119 Type 2 diabetes mellitus without complications: Secondary | ICD-10-CM | POA: Diagnosis not present

## 2020-03-31 DIAGNOSIS — N189 Chronic kidney disease, unspecified: Secondary | ICD-10-CM | POA: Diagnosis not present

## 2020-04-05 DIAGNOSIS — I83018 Varicose veins of right lower extremity with ulcer other part of lower leg: Secondary | ICD-10-CM | POA: Diagnosis not present

## 2020-04-05 DIAGNOSIS — N184 Chronic kidney disease, stage 4 (severe): Secondary | ICD-10-CM | POA: Diagnosis not present

## 2020-04-06 DIAGNOSIS — L97509 Non-pressure chronic ulcer of other part of unspecified foot with unspecified severity: Secondary | ICD-10-CM | POA: Diagnosis not present

## 2020-04-06 DIAGNOSIS — N184 Chronic kidney disease, stage 4 (severe): Secondary | ICD-10-CM | POA: Diagnosis not present

## 2020-04-07 DIAGNOSIS — N184 Chronic kidney disease, stage 4 (severe): Secondary | ICD-10-CM | POA: Diagnosis not present

## 2020-04-07 DIAGNOSIS — R635 Abnormal weight gain: Secondary | ICD-10-CM | POA: Diagnosis not present

## 2020-04-07 DIAGNOSIS — R609 Edema, unspecified: Secondary | ICD-10-CM | POA: Diagnosis not present

## 2020-04-07 DIAGNOSIS — F339 Major depressive disorder, recurrent, unspecified: Secondary | ICD-10-CM | POA: Diagnosis not present

## 2020-04-07 DIAGNOSIS — G4700 Insomnia, unspecified: Secondary | ICD-10-CM | POA: Diagnosis not present

## 2020-04-07 DIAGNOSIS — F0150 Vascular dementia without behavioral disturbance: Secondary | ICD-10-CM | POA: Diagnosis not present

## 2020-04-12 DIAGNOSIS — L579 Skin changes due to chronic exposure to nonionizing radiation, unspecified: Secondary | ICD-10-CM | POA: Diagnosis not present

## 2020-04-12 DIAGNOSIS — I83018 Varicose veins of right lower extremity with ulcer other part of lower leg: Secondary | ICD-10-CM | POA: Diagnosis not present

## 2020-04-13 DIAGNOSIS — R609 Edema, unspecified: Secondary | ICD-10-CM | POA: Diagnosis not present

## 2020-04-13 DIAGNOSIS — N184 Chronic kidney disease, stage 4 (severe): Secondary | ICD-10-CM | POA: Diagnosis not present

## 2020-04-13 DIAGNOSIS — R635 Abnormal weight gain: Secondary | ICD-10-CM | POA: Diagnosis not present

## 2021-02-23 DIAGNOSIS — I340 Nonrheumatic mitral (valve) insufficiency: Secondary | ICD-10-CM

## 2021-02-23 DIAGNOSIS — I361 Nonrheumatic tricuspid (valve) insufficiency: Secondary | ICD-10-CM

## 2021-05-17 DEATH — deceased
